# Patient Record
Sex: Male | Born: 1985 | Race: White | Hispanic: No | Marital: Married | Smoking: Former smoker
Health system: Southern US, Community
[De-identification: ages and names within clinical notes are randomized; demographics above are authoritative.]

## PROBLEM LIST (undated history)

## (undated) DIAGNOSIS — F329 Major depressive disorder, single episode, unspecified: Secondary | ICD-10-CM

## (undated) DIAGNOSIS — F909 Attention-deficit hyperactivity disorder, unspecified type: Secondary | ICD-10-CM

## (undated) DIAGNOSIS — F32A Depression, unspecified: Secondary | ICD-10-CM

## (undated) DIAGNOSIS — R519 Headache, unspecified: Secondary | ICD-10-CM

## (undated) DIAGNOSIS — F319 Bipolar disorder, unspecified: Secondary | ICD-10-CM

## (undated) HISTORY — DX: Major depressive disorder, single episode, unspecified: F32.9

## (undated) HISTORY — PX: VARICOCELECTOMY: SHX1084

## (undated) HISTORY — DX: Attention-deficit hyperactivity disorder, unspecified type: F90.9

## (undated) HISTORY — DX: Bipolar disorder, unspecified: F31.9

## (undated) HISTORY — DX: Depression, unspecified: F32.A

---
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
## 2009-12-31 ENCOUNTER — Ambulatory Visit (HOSPITAL_COMMUNITY): Payer: Self-pay | Admitting: Psychiatry

## 2010-02-21 ENCOUNTER — Ambulatory Visit (HOSPITAL_COMMUNITY): Payer: Self-pay | Admitting: Psychiatry

## 2010-04-23 ENCOUNTER — Ambulatory Visit (HOSPITAL_COMMUNITY): Payer: Self-pay | Admitting: Psychiatry

## 2010-06-25 ENCOUNTER — Ambulatory Visit (HOSPITAL_COMMUNITY): Payer: Self-pay | Admitting: Psychiatry

## 2010-07-17 ENCOUNTER — Ambulatory Visit (HOSPITAL_COMMUNITY): Payer: Self-pay | Admitting: Psychiatry

## 2010-09-17 ENCOUNTER — Encounter (HOSPITAL_COMMUNITY): Payer: Self-pay | Admitting: Psychiatry

## 2010-12-05 NOTE — Discharge Summary (Signed)
Robert Salazar, Robert Salazar NO.:  [id number]   MEDICAL RECORD NO.:  [id number]                   PATIENT TYPE:  IPS   LOCATION:  0302                                 FACILITY:  [facility]   PHYSICIAN:  Jeanice Lim, M.D.              DATE OF BIRTH:  1985/10/01   DATE OF ADMISSION:  06/27/2003  DATE OF DISCHARGE:  07/03/2003                                 DISCHARGE SUMMARY   IDENTIFYING DATA:  This is an 25 year old single white male voluntarily  admitted with a history of depression and suicidal ideation for one month.  Feeling like he may act on these.  Complaining of mood swings.  Burning self  with cigarettes.  Poor appetite.   MEDICATIONS:  In the past, on Seroquel, Zoloft and Depakote.  None recently.   ALLERGIES:  No known drug allergies.   PHYSICAL EXAMINATION:  Within normal limits.  Neurologically nonfocal.   LABORATORY DATA:  Routine admission labs within normal limits.   MENTAL STATUS EXAM:  Alert, young male.  Casually dressed.  Cooperative.  Poor eye contact.  Mood dysphoric.  Affect constricted.  Thought processes  goal directed.  Thought content negative for acute suicidal or homicidal  ideation or psychotic symptoms.  Cognitively intact.  Judgment and insight  poor.   ADMISSION DIAGNOSES:   AXIS I:  Rule out bipolar disorder, mixed-phase.   AXIS II:  Deferred.   AXIS III:  None.   AXIS IV:  Problems with primary support group.   AXIS V:  25/60.   HOSPITAL COURSE:  The patient was admitted and ordered routine p.r.n.  medications and underwent further monitoring.  Was encouraged to participate  in individual, group and milieu therapy.  The patient was detoxed with  monitoring for safety.  Depakote level was followed up as well as the  patient participated in therapy and aftercare planning.  The patient  complained of mood swings, auditory or visual hallucinations, anger,  irritability and was stabilized on medications including  Depakote, Zoloft,  Klonopin and Wellbutrin targeting depressive symptoms, acute anxiety and  mood instability.  The patient reported doing much better.  Reported no  hallucinations.  No suicidal or homicidal ideation.  Affect was brighter.   CONDITION ON DISCHARGE:  Markedly improved.  Tolerating medications and  showing increased insight regarding the importance of compliance with the  medications and follow-up.   DISCHARGE MEDICATIONS:  1. Zoloft 50 mg q.a.m.  2. Seroquel 200 mg, 2-1/2 q.h.s.  3. Wellbutrin XL 150 mg q.a.m.  4. Depakote 250 mg q.a.m. and 3 q.h.s.  5. Ambien 10 mg q.h.s.  6. Klonopin 55 mg q.h.s.   FOLLOW UP:  The patient was discharged to follow up with Dr. Milford Cage  on July 09, 2003 at 3:30 p.m.   DISCHARGE DIAGNOSES:   AXIS I:  Rule out bipolar disorder, mixed-phase.   AXIS II:  Deferred.   AXIS III:  None.   AXIS IV:  Problems with primary support group.   AXIS V:  Global Assessment of Functioning on discharge 55.                                               Jeanice Lim, M.D.    JEM/MEDQ  D:  08/01/2003  T:  08/02/2003  Job:  [id number]

## 2011-03-03 ENCOUNTER — Encounter (INDEPENDENT_AMBULATORY_CARE_PROVIDER_SITE_OTHER): Payer: 59 | Admitting: Psychiatry

## 2011-03-03 DIAGNOSIS — F909 Attention-deficit hyperactivity disorder, unspecified type: Secondary | ICD-10-CM

## 2011-03-03 DIAGNOSIS — F319 Bipolar disorder, unspecified: Secondary | ICD-10-CM

## 2011-05-04 ENCOUNTER — Encounter (HOSPITAL_COMMUNITY): Payer: Self-pay

## 2011-06-03 ENCOUNTER — Encounter (HOSPITAL_COMMUNITY): Payer: 59 | Admitting: Psychiatry

## 2011-06-04 ENCOUNTER — Encounter (HOSPITAL_COMMUNITY): Payer: Self-pay | Admitting: Psychiatry

## 2012-03-16 ENCOUNTER — Encounter (HOSPITAL_COMMUNITY): Payer: Self-pay | Admitting: Psychiatry

## 2012-09-20 ENCOUNTER — Ambulatory Visit (HOSPITAL_COMMUNITY): Payer: 59 | Admitting: Psychiatry

## 2018-08-06 NOTE — Progress Notes (Deleted)
   Subjective:    Patient ID: Robert Salazar, male    DOB: 1986-07-14, 34 y.o.   MRN: [id number]  HPI:  [name] is here to establish as a new pt. He is a pleasant 33 year old male. GEX:[id number], ADHD   No care team member to display  There are no active problems to display for this patient.    No past medical history on file.   *** The histories are not reviewed yet. Please review them in the "History" navigator section and refresh this SmartLink.   No family history on file.   Social History   Substance and Sexual Activity  Drug Use Not on file     Social History   Substance and Sexual Activity  Alcohol Use Not on file     Social History   Tobacco Use  Smoking Status Current Every Day Smoker  . Packs/day: 0.10  . Years: 5.00  . Pack years: 0.50  . Types: Cigarettes     Outpatient Encounter Medications as of 08/08/2018  Medication Sig  . citalopram (CELEXA) 10 MG tablet Take 10 mg by mouth daily.    Marland Kitchen lamoTRIgine (LAMICTAL) 150 MG tablet Take 150 mg by mouth daily.    Marland Kitchen lisdexamfetamine (VYVANSE) 60 MG capsule Take 60 mg by mouth every morning.     No facility-administered encounter medications on file as of 08/08/2018.     Allergies: Patient has no allergy information on record.  There is no height or weight on file to calculate BMI.  There were no vitals taken for this visit.     Review of Systems     Objective:   Physical Exam        Assessment & Plan:  No diagnosis found.  No problem-specific Assessment & Plan notes found for this encounter.    FOLLOW-UP:  No follow-ups on file.

## 2018-08-08 ENCOUNTER — Ambulatory Visit: Payer: 59 | Admitting: Adult Health

## 2018-08-24 NOTE — Progress Notes (Signed)
Subjective:    Patient ID: Robert Salazar, male    DOB: Feb 21, 1986, 33 y.o.   MRN: [id number]  HPI:  Robert Salazar is here to establish as a new pt.  He is a pleasant 33 year old male. PMH: ADD, Depression, Anxiety, Bipolar I, Schizoaffective disorder He is [facility] and [facility] He reports stable mood, denies thoughts of harming himself/others He remains active with house/yeard work, ie chopping wood, etc He is inconsistent with hydration, states "I am working on trying to drink > 1/2 gallon a day". He quit tobacco use 2016 He reports occasionally smoking marijuana and 1-2 cocktails/night Discussed possible interaction between alcohol, marijuana and his mental health medications He reports migraines began at age 33, with a month long migraine He estimates to have 1-2 migraines/month He reports that some migraines will last up to 3 days long, those will occur once every 3 month He reports visual disturbance with migraine He has not been seen by Neurologist in decaded He has not had routine physical in years He is married with one three year old son, "Robert Salazar"  Patient Care Team    Relationship Specialty Notifications Start End  Laylaa Guevarra, Jinny BlossomKaty D, NP PCP - General Family Medicine  08/30/18     Patient Active Problem List   Diagnosis Date Noted  . Healthcare maintenance 08/30/2018  . Bipolar 1 disorder (HCC) 08/30/2018  . Depression, recurrent (HCC) 08/30/2018  . GAD (generalized anxiety disorder) 08/30/2018     Past Medical History:  Diagnosis Date  . ADHD   . Bipolar 1 disorder (HCC)   . Depression      Past Surgical History:  Procedure Laterality Date  . VARICOCELECTOMY       Family History  Problem Relation Age of Onset  . Celiac disease Sister   . Cancer Maternal Grandmother        unknown to pt  . Cancer Paternal Grandfather        lung     Social History   Substance and Sexual Activity  Drug Use Never     Social  History   Substance and Sexual Activity  Alcohol Use Yes  . Alcohol/week: 10.0 standard drinks  . Types: 5 Glasses of wine, 5 Cans of beer per week     Social History   Tobacco Use  Smoking Status Former Smoker  . Packs/day: 1.00  . Years: 10.00  . Pack years: 10.00  . Types: Cigarettes  . Last attempt to quit: 07/20/2014  . Years since quitting: 4.1  Smokeless Tobacco Never Used     Outpatient Encounter Medications as of 08/30/2018  Medication Sig  . ALPRAZolam (XANAX) 1 MG tablet Take 1 mg by mouth. Take 1 tablet by mouth one to two times daily as needed for anxiety  . lamoTRIgine (LAMICTAL) 25 MG tablet Take 75 mg by mouth at bedtime.  Marland Kitchen. lisdexamfetamine (VYVANSE) 50 MG capsule Take 50 mg by mouth daily.  . temazepam (RESTORIL) 30 MG capsule Take 30 mg by mouth at bedtime as needed for sleep.  Marland Kitchen. VIIBRYD 40 MG TABS Take 1 tablet by mouth daily.  . [DISCONTINUED] ALPRAZolam (XANAX) 1 MG tablet Take 0.5 tablets by mouth daily as needed.  . [DISCONTINUED] citalopram (CELEXA) 10 MG tablet Take 10 mg by mouth daily.    . [DISCONTINUED] lamoTRIgine (LAMICTAL) 150 MG tablet Take 150 mg by mouth daily.    . [DISCONTINUED] lisdexamfetamine (VYVANSE) 60 MG capsule Take 60 mg by  mouth every morning.    . [DISCONTINUED] VYVANSE 40 MG capsule Take 1 capsule by mouth daily.   No facility-administered encounter medications on file as of 08/30/2018.     Allergies: Patient has no known allergies.  Body mass index is 20.57 kg/m.  Blood pressure 94/61, pulse (!) 59, temperature 99 F (37.2 C), temperature source Oral, height 6' 0.25" (1.835 m), weight 152 lb 11.2 oz (69.3 kg), SpO2 99 %.  Review of Systems  Constitutional: Positive for fatigue. Negative for activity change, appetite change, chills, diaphoresis, fever and unexpected weight change.  HENT: Positive for sore throat.   Respiratory: Negative for cough, chest tightness, shortness of breath, wheezing and stridor.    Cardiovascular: Negative for chest pain, palpitations and leg swelling.  Endocrine: Negative for cold intolerance, heat intolerance, polydipsia, polyphagia and polyuria.  Musculoskeletal: Negative for arthralgias, back pain, gait problem, joint swelling, myalgias, neck pain and neck stiffness.  Skin: Negative for color change, pallor, rash and wound.  Neurological: Positive for headaches. Negative for dizziness.  Hematological: Does not bruise/bleed easily.  Psychiatric/Behavioral: Positive for decreased concentration and dysphoric mood. Negative for agitation, behavioral problems, confusion, hallucinations, self-injury, sleep disturbance and suicidal ideas. The patient is nervous/anxious and is hyperactive.        Objective:   Physical Exam Vitals signs and nursing note reviewed.  Constitutional:      General: He is not in acute distress.    Appearance: He is normal weight. He is not ill-appearing, toxic-appearing or diaphoretic.  Cardiovascular:     Rate and Rhythm: Normal rate.     Pulses: Normal pulses.     Heart sounds: Normal heart sounds. No murmur. No friction rub. No gallop.   Pulmonary:     Effort: Pulmonary effort is normal. No respiratory distress.     Breath sounds: Normal breath sounds. No stridor. No wheezing, rhonchi or rales.  Chest:     Chest wall: No tenderness.  Skin:    Capillary Refill: Capillary refill takes less than 2 seconds.  Neurological:     Mental Status: He is alert and oriented to person, place, and time.     Cranial Nerves: No cranial nerve deficit.     Sensory: No sensory deficit.     Motor: No weakness.     Coordination: Coordination normal.     Gait: Gait normal.     Deep Tendon Reflexes: Reflexes normal.  Psychiatric:        Mood and Affect: Mood normal.        Behavior: Behavior normal.        Thought Content: Thought content normal.        Judgment: Judgment normal.       Assessment & Plan:   1. Need for Tdap vaccination   2.  Screening for HIV (human immunodeficiency virus)   3. Healthcare maintenance   4. Intractable migraine with aura with status migrainosus   5. Bipolar 1 disorder (HCC)   6. Depression, recurrent (HCC)   7. GAD (generalized anxiety disorder)     Healthcare maintenance Continue all medications as directed. Continue regular follow-up with [facility] and your Psychologist and Psychiatrist. Try to consistently drink >75 oz water/day. Recommend complete physical in 4 weeks, fasting lab appt the week prior.  Bipolar 1 disorder (HCC) Followed by Psychiatrist and Psychologist at [facility] and complex medication regime Denies thoughts of harming himself/others Has strong support system Discussed possible interaction between alcohol, marijuana and his mental health medications  Depression, recurrent (HCC) Followed by Psychiatrist and Psychologist at [facility] and complex medication regime Denies thoughts of harming himself/others Has strong support system Discussed possible interaction between alcohol, marijuana and his mental health medications  GAD (generalized anxiety disorder) Followed by Psychiatrist and Psychologist at [facility] and complex medication regime Denies thoughts of harming himself/others Has strong support system Discussed possible interaction between alcohol, marijuana and his mental health medications    FOLLOW-UP:  Return in about 4 weeks (around 09/27/2018) for Fasting Labs, CPE.

## 2018-08-30 ENCOUNTER — Encounter: Payer: Self-pay | Admitting: Adult Health

## 2018-08-30 ENCOUNTER — Ambulatory Visit (INDEPENDENT_AMBULATORY_CARE_PROVIDER_SITE_OTHER): Payer: 59 | Admitting: Adult Health

## 2018-08-30 ENCOUNTER — Encounter: Payer: Self-pay | Admitting: Neurology

## 2018-08-30 VITALS — BP 94/61 | HR 59 | Temp 99.0°F | Ht 72.25 in | Wt 152.7 lb

## 2018-08-30 DIAGNOSIS — Z114 Encounter for screening for human immunodeficiency virus [HIV]: Secondary | ICD-10-CM | POA: Diagnosis not present

## 2018-08-30 DIAGNOSIS — G43111 Migraine with aura, intractable, with status migrainosus: Secondary | ICD-10-CM

## 2018-08-30 DIAGNOSIS — F411 Generalized anxiety disorder: Secondary | ICD-10-CM | POA: Insufficient documentation

## 2018-08-30 DIAGNOSIS — F32A Depression, unspecified: Secondary | ICD-10-CM | POA: Insufficient documentation

## 2018-08-30 DIAGNOSIS — Z23 Encounter for immunization: Secondary | ICD-10-CM

## 2018-08-30 DIAGNOSIS — F319 Bipolar disorder, unspecified: Secondary | ICD-10-CM | POA: Diagnosis not present

## 2018-08-30 DIAGNOSIS — F339 Major depressive disorder, recurrent, unspecified: Secondary | ICD-10-CM

## 2018-08-30 DIAGNOSIS — Z0000 Encounter for general adult medical examination without abnormal findings: Secondary | ICD-10-CM

## 2018-08-30 NOTE — Assessment & Plan Note (Signed)
Followed by Psychiatrist and Psychologist at [facility] and complex medication regime Denies thoughts of harming himself/others Has strong support system Discussed possible interaction between alcohol, marijuana and his mental health medications 

## 2018-08-30 NOTE — Assessment & Plan Note (Signed)
Follow[facility] by [facility] and [facility] and complex medication regime Denies thoughts of harming himself/others Has strong support system Discussed possible interaction between alcohol, marijuana and his mental health medications

## 2018-08-30 NOTE — Patient Instructions (Signed)
Mediterranean Diet A Mediterranean diet refers to food and lifestyle choices that are based on the traditions of countries located on the [employer]. This way of eating has been shown to help prevent certain conditions and improve outcomes for people who have chronic diseases, like kidney disease and heart disease. What are tips for following this plan? Lifestyle  Cook and eat meals together with your family, when possible.  Drink enough fluid to keep your urine clear or pale yellow.  Be physically active every day. This includes: ? Aerobic exercise like running or swimming. ? Leisure activities like gardening, walking, or housework.  Get 7-8 hours of sleep each night.  If recommended by your health care provider, drink red wine in moderation. This means 1 glass a day for nonpregnant women and 2 glasses a day for men. A glass of wine equals 5 oz (150 mL). Reading food labels   Check the serving size of packaged foods. For foods such as rice and pasta, the serving size refers to the amount of cooked product, not dry.  Check the total fat in packaged foods. Avoid foods that have saturated fat or trans fats.  Check the ingredients list for added sugars, such as corn syrup. Shopping  At the grocery store, buy most of your food from the areas near the walls of the store. This includes: ? Fresh fruits and vegetables (produce). ? Grains, beans, nuts, and seeds. Some of these may be available in unpackaged forms or large amounts (in bulk). ? Fresh seafood. ? Poultry and eggs. ? Low-fat dairy products.  Buy whole ingredients instead of prepackaged foods.  Buy fresh fruits and vegetables in-season from local farmers markets.  Buy frozen fruits and vegetables in resealable bags.  If you do not have access to quality fresh seafood, buy precooked frozen shrimp or canned fish, such as tuna, salmon, or sardines.  Buy small amounts of raw or cooked vegetables, salads, or olives from  the deli or salad bar at your store.  Stock your pantry so you always have certain foods on hand, such as olive oil, canned tuna, canned tomatoes, rice, pasta, and beans. Cooking  Cook foods with extra-virgin olive oil instead of using butter or other vegetable oils.  Have meat as a side dish, and have vegetables or grains as your main dish. This means having meat in small portions or adding small amounts of meat to foods like pasta or stew.  Use beans or vegetables instead of meat in common dishes like chili or lasagna.  Experiment with different cooking methods. Try roasting or broiling vegetables instead of steaming or sauteing them.  Add frozen vegetables to soups, stews, pasta, or rice.  Add nuts or seeds for added healthy fat at each meal. You can add these to yogurt, salads, or vegetable dishes.  Marinate fish or vegetables using olive oil, lemon juice, garlic, and fresh herbs. Meal planning   Plan to eat 1 vegetarian meal one day each week. Try to work up to 2 vegetarian meals, if possible.  Eat seafood 2 or more times a week.  Have healthy snacks readily available, such as: ? Vegetable sticks with hummus. ? [place] yogurt. ? Fruit and nut trail mix.  Eat balanced meals throughout the week. This includes: ? Fruit: 2-3 servings a day ? Vegetables: 4-5 servings a day ? Low-fat dairy: 2 servings a day ? Fish, poultry, or lean meat: 1 serving a day ? Beans and legumes: 2 or more servings a week ?  Nuts and seeds: 1-2 servings a day ? Whole grains: 6-8 servings a day ? Extra-virgin olive oil: 3-4 servings a day  Limit red meat and sweets to only a few servings a month What are my food choices?  Mediterranean diet ? Recommended ? Grains: Whole-grain pasta. Brown rice. Bulgar wheat. Polenta. Couscous. Whole-wheat bread. Orpah Cobb. ? Vegetables: Artichokes. Beets. Broccoli. Cabbage. Carrots. Eggplant. Green beans. Chard. Kale. Spinach. Onions. Leeks. Peas. Squash.  Tomatoes. Peppers. Radishes. ? Fruits: Apples. Apricots. Avocado. Berries. Bananas. Cherries. Dates. Figs. Grapes. Lemons. Melon. Oranges. Peaches. Plums. Pomegranate. ? Meats and other protein foods: Beans. Almonds. Sunflower seeds. Pine nuts. Peanuts. Cod. Salmon. Scallops. Shrimp. Tuna. Tilapia. Clams. Oysters. Eggs. ? Dairy: Low-fat milk. Cheese. Greek yogurt. ? Beverages: Water. Red wine. Herbal tea. ? Fats and oils: Extra virgin olive oil. Avocado oil. Grape seed oil. ? Sweets and desserts: [place] yogurt with honey. Baked apples. Poached pears. Trail mix. ? Seasoning and other foods: Basil. Cilantro. Coriander. Cumin. Mint. Parsley. Sage. Rosemary. Tarragon. Garlic. Oregano. Thyme. Pepper. Balsalmic vinegar. Tahini. Hummus. Tomato sauce. Olives. Mushrooms. ? Limit these ? Grains: Prepackaged pasta or rice dishes. Prepackaged cereal with added sugar. ? Vegetables: Deep fried potatoes (french fries). ? Fruits: Fruit canned in syrup. ? Meats and other protein foods: Beef. Pork. Lamb. Poultry with skin. Hot dogs. Tomasa Blase. ? Dairy: Ice cream. Sour cream. Whole milk. ? Beverages: Juice. Sugar-sweetened soft drinks. Beer. Liquor and spirits. ? Fats and oils: Butter. Canola oil. Vegetable oil. Beef fat (tallow). Lard. ? Sweets and desserts: Cookies. Cakes. Pies. Candy. ? Seasoning and other foods: Mayonnaise. Premade sauces and marinades. ? The items listed may not be a complete list. Talk with your dietitian about what dietary choices are right for you. Summary  The Mediterranean diet includes both food and lifestyle choices.  Eat a variety of fresh fruits and vegetables, beans, nuts, seeds, and whole grains.  Limit the amount of red meat and sweets that you eat.  Talk with your health care provider about whether it is safe for you to drink red wine in moderation. This means 1 glass a day for nonpregnant women and 2 glasses a day for men. A glass of wine equals 5 oz (150 mL). This information  is not intended to replace advice given to you by your health care provider. Make sure you discuss any questions you have with your health care provider. Document Released: 02/27/2016 Document Revised: 03/31/2016 Document Reviewed: 02/27/2016 Elsevier Interactive Patient Education  2019 [employer].  Continue all medications as directed. Continue regular follow-up with [facility] and your Psychologist and Psychiatrist. Try to consistently drink >75 oz water/day. Recommend complete physical in 4 weeks, fasting lab appt the week prior. WELCOME TO THE PRACTICE!

## 2018-08-30 NOTE — Assessment & Plan Note (Signed)
Continue all medications as directed. Continue regular follow-up with [facility] and your Psychologist and Psychiatrist. Try to consistently drink >75 oz water/day. Recommend complete physical in 4 weeks, fasting lab appt the week prior.

## 2018-10-10 ENCOUNTER — Other Ambulatory Visit: Payer: 59

## 2018-10-13 ENCOUNTER — Encounter: Payer: 59 | Admitting: Adult Health

## 2018-11-10 ENCOUNTER — Ambulatory Visit: Payer: 59 | Admitting: Neurology

## 2018-12-14 ENCOUNTER — Encounter: Payer: Self-pay | Admitting: Adult Health

## 2018-12-14 ENCOUNTER — Ambulatory Visit: Payer: 59 | Admitting: Adult Health

## 2018-12-14 ENCOUNTER — Other Ambulatory Visit: Payer: Self-pay

## 2018-12-14 VITALS — Temp 99.1°F | Ht 72.25 in | Wt 155.0 lb

## 2018-12-14 NOTE — Progress Notes (Signed)
Subjective:    Patient ID: Robert Salazar, male    DOB: 12-13-1985, 33 y.o.   MRN: [id number]  HPI: [name] presents with insect bite that occurred 4 days ago.  He reports waking up Monday am with "spide bite on my L upper chest". He reports initial piercing pain and severe itching sensation He reports pain starts over L chest with radiation to L shoulder-trapezius- occipital head. He also reports malaise and myalgia's of upper chest He reports low grade fever (highest 99.39f oral). He denies N/V/D. He denies any OTC treatments. Today he denies any pain and he is afebrile- current temp, 98.60f oral. He denies any drainage from site. He denies any noticeable swollen lymph nodes  Fasting labs obtained today  Patient Care Team    Relationship Specialty Notifications Start End  Julaine Fusi, NP PCP - General Family Medicine  08/30/18     Patient Active Problem List   Diagnosis Date Noted  . Insect bite of left front wall of thorax 12/15/2018  . Healthcare maintenance 08/30/2018  . Bipolar 1 disorder (HCC) 08/30/2018  . Depression, recurrent (HCC) 08/30/2018  . GAD (generalized anxiety disorder) 08/30/2018     Past Medical History:  Diagnosis Date  . ADHD   . Bipolar 1 disorder (HCC)   . Depression      Past Surgical History:  Procedure Laterality Date  . VARICOCELECTOMY       Family History  Problem Relation Age of Onset  . Celiac disease Sister   . Cancer Maternal Grandmother        unknown to pt  . Cancer Paternal Grandfather        lung     Social History   Substance and Sexual Activity  Drug Use Never     Social History   Substance and Sexual Activity  Alcohol Use Yes  . Alcohol/week: 10.0 standard drinks  . Types: 5 Glasses of wine, 5 Cans of beer per week     Social History   Tobacco Use  Smoking Status Former Smoker  . Packs/day: 1.00  . Years: 10.00  . Pack years: 10.00  . Types: Cigarettes  . Last attempt to quit: 07/20/2014  .  Years since quitting: 4.4  Smokeless Tobacco Never Used     Outpatient Encounter Medications as of 12/15/2018  Medication Sig  . ALPRAZolam (XANAX) 1 MG tablet Take 1 mg by mouth. Take 1 tablet by mouth one to two times daily as needed for anxiety  . lisdexamfetamine (VYVANSE) 50 MG capsule Take 50 mg by mouth daily.  . temazepam (RESTORIL) 30 MG capsule Take 30 mg by mouth at bedtime as needed for sleep.  Marland Kitchen VIIBRYD 40 MG TABS Take 1 tablet by mouth daily.  . mupirocin cream (BACTROBAN) 2 % Apply 1 application topically 2 (two) times daily.   No facility-administered encounter medications on file as of 12/15/2018.     Allergies: Patient has no known allergies.  Body mass index is 21.55 kg/m.  Blood pressure 95/61, pulse (!) 57, temperature 98.7 F (37.1 C), temperature source Oral, height 6' 0.25" (1.835 m), weight 160 lb (72.6 kg), SpO2 97 %.  Review of Systems  Constitutional: Positive for fatigue. Negative for activity change, appetite change, chills, diaphoresis, fever and unexpected weight change.  HENT: Negative for congestion.   Eyes: Negative for visual disturbance.  Respiratory: Negative for cough, chest tightness, shortness of breath, wheezing and stridor.   Cardiovascular: Negative for chest pain, palpitations and  leg swelling.  Gastrointestinal: Negative for abdominal distention, abdominal pain, anal bleeding, constipation, diarrhea, nausea and vomiting.  Endocrine: Negative for cold intolerance, heat intolerance, polydipsia, polyphagia and polyuria.  Genitourinary: Negative for difficulty urinating and flank pain.  Musculoskeletal: Positive for myalgias. Negative for arthralgias, back pain, gait problem, joint swelling, neck pain and neck stiffness.  Skin: Positive for color change and wound. Negative for pallor and rash.  Neurological: Negative for dizziness and headaches.  Hematological: Does not bruise/bleed easily.  Psychiatric/Behavioral: Negative for agitation,  behavioral problems, confusion, decreased concentration, dysphoric mood, hallucinations, self-injury, sleep disturbance and suicidal ideas. The patient is not nervous/anxious and is not hyperactive.        Objective:   Physical Exam Vitals signs and nursing note reviewed.  Constitutional:      General: He is not in acute distress.    Appearance: Normal appearance. He is not ill-appearing, toxic-appearing or diaphoretic.  Cardiovascular:     Rate and Rhythm: Bradycardia present.     Pulses: Normal pulses.     Heart sounds: Normal heart sounds. No murmur. No friction rub. No gallop.   Pulmonary:     Effort: Pulmonary effort is normal. No respiratory distress.     Breath sounds: Normal breath sounds. No stridor. No wheezing, rhonchi or rales.  Chest:     Chest wall: No tenderness.  Skin:    General: Skin is warm and dry.     Capillary Refill: Capillary refill takes less than 2 seconds.     Findings: Erythema and wound present.          Comments: L anterior chest: 2 insect puncture wounds Top one- measruing 0.6cm in diameter Lower one- measruing 0.3cm in diameter Both with circular areas of erythema No open tissue/drainage/streaking noted No swollen cervical or axillary lymph nodes appreciated  Neurological:     Mental Status: He is alert and oriented to person, place, and time.  Psychiatric:        Mood and Affect: Mood normal.        Behavior: Behavior normal.        Thought Content: Thought content normal.        Judgment: Judgment normal.       Assessment & Plan:   1. Healthcare maintenance   2. Screening for HIV (human immunodeficiency virus)   3. Insect bite of left front wall of thorax, initial encounter     Insect bite of left front wall of thorax In comparison to the pictures from earlier in the week to presentation today- bite site is much improved. Please follow care instructions as detailed above. Please apply Mupirocin 2% cream twice daily. Marland Kitchen.   Healthcare maintenance Will contact you tomorrow with your lab results. Please re-schedule your complete physical    FOLLOW-UP:  Return if symptoms worsen or fail to improve.

## 2018-12-14 NOTE — Progress Notes (Signed)
Appt moved to next day to in clinic

## 2018-12-15 ENCOUNTER — Ambulatory Visit (INDEPENDENT_AMBULATORY_CARE_PROVIDER_SITE_OTHER): Payer: 59 | Admitting: Adult Health

## 2018-12-15 ENCOUNTER — Encounter: Payer: Self-pay | Admitting: Adult Health

## 2018-12-15 DIAGNOSIS — S20362A Insect bite (nonvenomous) of left front wall of thorax, initial encounter: Secondary | ICD-10-CM | POA: Diagnosis not present

## 2018-12-15 DIAGNOSIS — Z0000 Encounter for general adult medical examination without abnormal findings: Secondary | ICD-10-CM

## 2018-12-15 DIAGNOSIS — Z114 Encounter for screening for human immunodeficiency virus [HIV]: Secondary | ICD-10-CM

## 2018-12-15 DIAGNOSIS — W57XXXA Bitten or stung by nonvenomous insect and other nonvenomous arthropods, initial encounter: Secondary | ICD-10-CM | POA: Diagnosis not present

## 2018-12-15 MED ORDER — MUPIROCIN CALCIUM 2 % EX CREA: 1.0000 "application " | TOPICAL_CREAM | Freq: Two times a day (BID) | CUTANEOUS | 0 refills | Status: DC

## 2018-12-15 NOTE — Assessment & Plan Note (Signed)
Will contact you tomorrow with your lab results. Please re-schedule your complete physical

## 2018-12-15 NOTE — Patient Instructions (Addendum)
Spider Bite  Spider bites are not common. Most spider bites do not cause serious problems. There are only a few types of spider bites that can cause serious health problems. Follow these instructions at home: Medicine  Take or apply over-the-counter and prescription medicines only as told by your doctor.  If you were given an antibiotic medicine, take or apply it as told by your doctor. Do not stop using the antibiotic even if your condition improves. General instructions  Do not scratch the bite area.  Keep the bite area clean and dry. Wash the bite area with soap and water every day as told by your doctor.  If directed, apply ice to the bite area. ? Put ice in a plastic bag. ? Place a towel between your skin and the bag. ? Leave the ice on for 20 minutes, 2-3 times per day.  Raise (elevate) the affected area above the level of your heart while you are sitting or lying down, if this is possible.  Keep all follow-up visits as told by your doctor. This is important. Contact a doctor if:  Your bite does not get better after 3 days.  Your bite turns black or purple.  Near the bite, you have: ? Redness. ? Swelling (inflammation). ? Pain that is getting worse. Get help right away if:  You get shortness of breath or chest pain.  You have fluid, blood, or pus coming from the bite area.  You have muscle cramps or painful muscle spasms.  You have stomach (abdominal) pain.  You feel sick to your stomach (nauseous) or you throw up (vomit).  You feel more tired or sleepy than you normally do. This information is not intended to replace advice given to you by your health care provider. Make sure you discuss any questions you have with your health care provider. Document Released: 08/08/2010 Document Revised: 03/02/2016 Document Reviewed: 11/21/2014 Elsevier Interactive Patient Education  [employer].   In comparison to the pictures from earlier in the week to presentation  today- bite site is much improved. Please follow care instructions as detailed above. Please apply Mupirocin 2% cream twice daily. Will contact you tomorrow with your lab results. Please re-schedule your complete physical. NICE TO SEE YOU!

## 2018-12-15 NOTE — Assessment & Plan Note (Signed)
In comparison to the pictures from earlier in the week to presentation today- bite site is much improved. Please follow care instructions as detailed above. Please apply Mupirocin 2% cream twice daily. Marland Kitchen

## 2018-12-16 ENCOUNTER — Encounter: Payer: Self-pay | Admitting: Adult Health

## 2018-12-16 LAB — CBC WITH DIFFERENTIAL/PLATELET
Basophils Absolute: 0 10*3/uL (ref 0.0–0.2)
Basos: 1 %
EOS (ABSOLUTE): 0.2 10*3/uL (ref 0.0–0.4)
Eos: 4 %
Hematocrit: 45.1 % (ref 37.5–51.0)
Hemoglobin: 16 g/dL (ref 13.0–17.7)
Immature Grans (Abs): 0 10*3/uL (ref 0.0–0.1)
Immature Granulocytes: 0 %
Lymphocytes Absolute: 1.6 10*3/uL (ref 0.7–3.1)
Lymphs: 31 %
MCH: 34 pg — ABNORMAL HIGH (ref 26.6–33.0)
MCHC: 35.5 g/dL (ref 31.5–35.7)
MCV: 96 fL (ref 79–97)
Monocytes Absolute: 0.5 10*3/uL (ref 0.1–0.9)
Monocytes: 11 %
Neutrophils Absolute: 2.7 10*3/uL (ref 1.4–7.0)
Neutrophils: 53 %
Platelets: 221 10*3/uL (ref 150–450)
RBC: 4.71 x10E6/uL (ref 4.14–5.80)
RDW: 12.7 % (ref 11.6–15.4)
WBC: 5 10*3/uL (ref 3.4–10.8)

## 2018-12-16 LAB — COMPREHENSIVE METABOLIC PANEL
ALT: 32 IU/L (ref 0–44)
AST: 34 IU/L (ref 0–40)
Albumin/Globulin Ratio: 1.9 (ref 1.2–2.2)
Albumin: 5 g/dL (ref 4.0–5.0)
Alkaline Phosphatase: 54 IU/L (ref 39–117)
BUN/Creatinine Ratio: 11 (ref 9–20)
BUN: 13 mg/dL (ref 6–20)
Bilirubin Total: 0.7 mg/dL (ref 0.0–1.2)
CO2: 24 mmol/L (ref 20–29)
Calcium: 9.8 mg/dL (ref 8.7–10.2)
Chloride: 98 mmol/L (ref 96–106)
Creatinine, Ser: 1.18 mg/dL (ref 0.76–1.27)
GFR calc Af Amer: 93 mL/min/{1.73_m2} (ref >59)
GFR calc non Af Amer: 81 mL/min/{1.73_m2} (ref >59)
Globulin, Total: 2.6 g/dL (ref 1.5–4.5)
Glucose: 89 mg/dL (ref 65–99)
Potassium: 5.2 mmol/L (ref 3.5–5.2)
Sodium: 137 mmol/L (ref 134–144)
Total Protein: 7.6 g/dL (ref 6.0–8.5)

## 2018-12-16 LAB — LIPID PANEL
Chol/HDL Ratio: 3.6 ratio (ref 0.0–5.0)
Cholesterol, Total: 245 mg/dL — ABNORMAL HIGH (ref 100–199)
HDL: 68 mg/dL (ref >39)
LDL Calculated: 154 mg/dL — ABNORMAL HIGH (ref 0–99)
Triglycerides: 113 mg/dL (ref 0–149)
VLDL Cholesterol Cal: 23 mg/dL (ref 5–40)

## 2018-12-16 LAB — HIV ANTIBODY (ROUTINE TESTING W REFLEX): HIV Screen 4th Generation wRfx: NONREACTIVE

## 2018-12-16 LAB — TSH: TSH: 2.4 u[IU]/mL (ref 0.450–4.500)

## 2018-12-16 LAB — HEMOGLOBIN A1C
Est. average glucose Bld gHb Est-mCnc: 94 mg/dL
Hgb A1c MFr Bld: 4.9 % (ref 4.8–5.6)

## 2018-12-27 NOTE — Progress Notes (Signed)
Subjective:    Patient ID: Robert Salazar, male    DOB: 11-11-1985, 33 y.o.   MRN: [id number]  HPI:  Robert Salazar is here for CPE He reports stable mood and denies medication SE on alprazolam, Vyvanse, and Temazepam. He is followed by Psychiatrist monthly. He reports good focus/concentration with Vyvanse. 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  Cholesterol Panel-  Some elevations  Total Chol- 245, normal <199  Trigs- nomal 113  HDL (good cholesterol) 68- very good, the higher this number the more cardiovascular protective health  LDL (bad cholesterol) 154,elevated, normal is <99  Please focus on reducing saturated fat in your diet to help normalize your total and LDL levels.  Since your HDL is so high it is actually negating some the bad the levels.  Recommend you work on your diet and we re-check your cholesterol levels in 6 months.   Healthcare Maintenance: Immunizations:UTD   Patient Care Team    Relationship Specialty Notifications Start End  JasperDanford, Jinny BlossomKaty D, NP PCP - General Family Medicine  08/30/18     Patient Active Problem List   Diagnosis Date Noted  . Elevated LDL cholesterol level 12/28/2018  . Insect bite of left front wall of thorax 12/15/2018  . Healthcare maintenance 08/30/2018  . Bipolar 1 disorder (HCC) 08/30/2018  . Depression, recurrent (HCC) 08/30/2018  . GAD (generalized anxiety disorder) 08/30/2018     Past Medical History:  Diagnosis Date  . ADHD   . Bipolar 1 disorder (HCC)   . Depression      Past Surgical History:  Procedure Laterality Date  . VARICOCELECTOMY       Family History  Problem Relation Age of Onset  .  Celiac disease Sister   . Cancer Maternal Grandmother        unknown to pt  . Cancer Paternal Grandfather        lung     Social History   Substance and Sexual Activity  Drug Use Never     Social History   Substance and Sexual Activity  Alcohol Use Yes  . Alcohol/week: 10.0 standard drinks  . Types: 5 Glasses of wine, 5 Cans of beer per week     Social History   Tobacco Use  Smoking Status Former Smoker  . Packs/day: 1.00  . Years: 10.00  . Pack years: 10.00  . Types: Cigarettes  . Last attempt to quit: 07/20/2014  . Years since quitting: 4.4  Smokeless Tobacco Never Used     Outpatient Encounter Medications as of 12/28/2018  Medication Sig  . ALPRAZolam (XANAX) 1 MG tablet Take 1 mg by mouth. Take 1 tablet by mouth one to two times daily as needed for anxiety  . lisdexamfetamine (VYVANSE) 50 MG capsule Take 50 mg by mouth daily.  . mupirocin cream (BACTROBAN) 2 % Apply 1 application topically 2 (two) times daily.  . temazepam (RESTORIL) 30 MG capsule Take 30 mg by mouth at bedtime as needed for sleep.  Marland Kitchen. VIIBRYD 40 MG TABS Take 1 tablet by mouth daily.   No facility-administered encounter medications on file as of 12/28/2018.     Allergies: Patient has no  known allergies.  Body mass index is 20.88 kg/m.  Blood pressure 97/65, pulse 78, temperature 98.8 F (37.1 C), temperature source Oral, height 6' 0.75" (1.848 m), weight 157 lb 3.2 oz (71.3 kg), SpO2 96 %.     Review of Systems  Constitutional: Negative for activity change, appetite change, chills, diaphoresis, fatigue, fever and unexpected weight change.  HENT: Negative for congestion.   Eyes: Negative for visual disturbance.  Respiratory: Negative for cough, chest tightness, shortness of breath, wheezing and stridor.   Cardiovascular: Negative for chest pain, palpitations and leg swelling.  Gastrointestinal: Negative for abdominal distention, abdominal pain, anal bleeding, blood in stool,  constipation, diarrhea, nausea and vomiting.  Endocrine: Negative for cold intolerance, heat intolerance, polydipsia, polyphagia and polyuria.  Genitourinary: Negative for difficulty urinating and flank pain.  Musculoskeletal: Negative for arthralgias, back pain, gait problem, joint swelling, myalgias, neck pain and neck stiffness.  Skin: Negative for color change, pallor, rash and wound.  Neurological: Negative for dizziness and headaches.  Hematological: Does not bruise/bleed easily.  Psychiatric/Behavioral: Negative for agitation, behavioral problems, confusion, decreased concentration, dysphoric mood, hallucinations, self-injury, sleep disturbance and suicidal ideas. The patient is not nervous/anxious and is not hyperactive.        Objective:   Physical Exam Vitals signs and nursing note reviewed.  Constitutional:      General: He is not in acute distress.    Appearance: Normal appearance. He is normal weight. He is not ill-appearing, toxic-appearing or diaphoretic.  HENT:     Head: Normocephalic and atraumatic.     Right Ear: Tympanic membrane, ear canal and external ear normal. There is no impacted cerumen.     Left Ear: Tympanic membrane, ear canal and external ear normal.     Nose: Nose normal. No congestion.     Mouth/Throat:     Mouth: Mucous membranes are moist.     Pharynx: No oropharyngeal exudate or posterior oropharyngeal erythema.  Eyes:     Extraocular Movements: Extraocular movements intact.     Conjunctiva/sclera: Conjunctivae normal.     Pupils: Pupils are equal, round, and reactive to light.  Neck:     Musculoskeletal: Normal range of motion. No muscular tenderness.  Cardiovascular:     Rate and Rhythm: Normal rate.     Pulses: Normal pulses.     Heart sounds: Normal heart sounds. No murmur. No friction rub. No gallop.   Pulmonary:     Effort: Pulmonary effort is normal. No respiratory distress.     Breath sounds: Normal breath sounds. No stridor. No  wheezing, rhonchi or rales.  Chest:     Chest wall: No tenderness.  Abdominal:     General: Abdomen is flat. Bowel sounds are normal. There is no distension.     Palpations: Abdomen is soft. There is no mass.     Tenderness: There is no abdominal tenderness. There is no right CVA tenderness, left CVA tenderness, guarding or rebound.     Hernia: No hernia is present.  Musculoskeletal: Normal range of motion.        General: No swelling, tenderness, deformity or signs of injury.     Right lower leg: No edema.     Left lower leg: No edema.  Skin:    General: Skin is warm and dry.     Capillary Refill: Capillary refill takes less than 2 seconds.     Coloration: Skin is not pale.  Neurological:     Mental Status: He is alert and oriented  to person, place, and time.     Sensory: No sensory deficit.     Coordination: Coordination normal.  Psychiatric:        Mood and Affect: Mood normal.        Behavior: Behavior normal.        Thought Content: Thought content normal.        Judgment: Judgment normal.       Assessment & Plan:   1. Elevated LDL cholesterol level   2. Healthcare maintenance     Healthcare maintenance  Overall your blood pressure, weight, lab work looks good, with exception of Total Cholesterol- 245 (normal <199) LDL (bad) cholesterol- 154 (normal <99) Please focus on significant reduction of saturated fat (ie cheese, whole milk, eggs, red meat). Recommend re-checking cholesterol levels in 6 months- please schedule fasting lab appt in 6 months. Remain well hydrated and continue regular exercise. Please schedule complete physical in 1 year. Continue to social distance and wear a mask when in public.  Elevated LDL cholesterol level June 2020 Total Chol- 245, normal <199  Trigs- nomal 113  HDL (good cholesterol) 68- very good, the higher this number the more cardiovascular protective health  LDL (bad cholesterol) 154,elevated, normal is <99  Please focus on  reducing saturated fat in your diet to help normalize your total and LDL levels.  Since your HDL is so high it is actually negating some the bad the levels.  Recommend you work on your diet and we re-check your cholesterol levels in 6 months.     FOLLOW-UP:  Return in about 6 months (around 06/29/2019) for Fasting Labs.

## 2018-12-28 ENCOUNTER — Other Ambulatory Visit: Payer: Self-pay

## 2018-12-28 ENCOUNTER — Encounter: Payer: Self-pay | Admitting: Adult Health

## 2018-12-28 ENCOUNTER — Ambulatory Visit (INDEPENDENT_AMBULATORY_CARE_PROVIDER_SITE_OTHER): Payer: 59 | Admitting: Adult Health

## 2018-12-28 VITALS — BP 97/65 | HR 78 | Temp 98.8°F | Ht 72.75 in | Wt 157.2 lb

## 2018-12-28 DIAGNOSIS — Z0000 Encounter for general adult medical examination without abnormal findings: Secondary | ICD-10-CM | POA: Diagnosis not present

## 2018-12-28 DIAGNOSIS — E7800 Pure hypercholesterolemia, unspecified: Secondary | ICD-10-CM | POA: Diagnosis not present

## 2018-12-28 NOTE — Assessment & Plan Note (Signed)
June 2020 Total Chol- 245, normal <199  Trigs- nomal 113  HDL (good cholesterol) 68- very good, the higher this number the more cardiovascular protective health  LDL (bad cholesterol) 154,elevated, normal is <99  Please focus on reducing saturated fat in your diet to help normalize your total and LDL levels.  Since your HDL is so high it is actually negating some the bad the levels.  Recommend you work on your diet and we re-check your cholesterol levels in 6 months.

## 2018-12-28 NOTE — Assessment & Plan Note (Signed)
  Overall your blood pressure, weight, lab work looks good, with exception of Total Cholesterol- 245 (normal <199) LDL (bad) cholesterol- 154 (normal <99) Please focus on significant reduction of saturated fat (ie cheese, whole milk, eggs, red meat). Recommend re-checking cholesterol levels in 6 months- please schedule fasting lab appt in 6 months. Remain well hydrated and continue regular exercise. Please schedule complete physical in 1 year. Continue to social distance and wear a mask when in public.

## 2018-12-28 NOTE — Patient Instructions (Addendum)

## 2019-01-12 ENCOUNTER — Ambulatory Visit: Payer: 59 | Admitting: Neurology

## 2019-01-22 ENCOUNTER — Encounter: Payer: Self-pay | Admitting: Adult Health

## 2019-01-23 ENCOUNTER — Telehealth: Payer: Self-pay | Admitting: Neurology

## 2019-01-23 NOTE — Telephone Encounter (Signed)
Patient left msg with after hours about having a migraine so bad he had hearing loss. Thanks!
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
## 2019-01-23 NOTE — Telephone Encounter (Signed)
Called and spoke with Thurmond Butts. I advised him he will need to r/s the appt that was cx before we can offer any type of medical advice. I did advise him he should be evaluated in ED or Urgent care. Transferred him to front office to make an appt.

## 2019-01-24 ENCOUNTER — Other Ambulatory Visit: Payer: Self-pay

## 2019-01-24 ENCOUNTER — Ambulatory Visit (INDEPENDENT_AMBULATORY_CARE_PROVIDER_SITE_OTHER): Payer: 59 | Admitting: Adult Health

## 2019-01-24 ENCOUNTER — Encounter: Payer: Self-pay | Admitting: Adult Health

## 2019-01-24 DIAGNOSIS — Z0000 Encounter for general adult medical examination without abnormal findings: Secondary | ICD-10-CM

## 2019-01-24 DIAGNOSIS — H9192 Unspecified hearing loss, left ear: Secondary | ICD-10-CM

## 2019-01-24 DIAGNOSIS — IMO0001 Reserved for inherently not codable concepts without codable children: Secondary | ICD-10-CM | POA: Insufficient documentation

## 2019-01-24 NOTE — Assessment & Plan Note (Signed)
1) If your hearing does not improve in 48 hrs, please call the clinic and we will refer your to an Audiologist for evaluation. 2) please avoid exposure to loud noises and if your will be in vicinity of loud noises recommend hearing protection. 3) please keep your Neurology appt on 02/06/2019 with Dr. Tomi Likens.

## 2019-01-24 NOTE — Assessment & Plan Note (Signed)
Continue to social distance and when out in public wear a mask. 

## 2019-01-24 NOTE — Patient Instructions (Addendum)
Hearing Loss Hearing loss is a partial or total loss of the ability to hear. This can be temporary or permanent, and it can happen in one or both ears. Medical care is necessary to treat hearing loss properly and to prevent the condition from getting worse. Your hearing may partially or completely come back, depending on what caused your hearing loss and how severe it is. In some cases, hearing loss is permanent. What are the causes? Common causes of hearing loss include:  Too much wax in the ear canal.  Infection of the ear canal or middle ear.  Fluid in the middle ear.  Injury to the ear or surrounding area.  An object stuck in the ear.  A history of prolonged exposure to loud sounds, such as music. Less common causes of hearing loss include:  Tumors in the ear.  Viral or bacterial infections, such as meningitis.  A hole in the eardrum (perforated eardrum).  Problems with the hearing nerve that sends signals between the brain and the ear.  Certain medicines. What are the signs or symptoms? Symptoms of this condition may include:  Difficulty telling the difference between sounds.  Difficulty following a conversation when there is background noise.  Lack of response to sounds in your environment. This may be most noticeable when you do not respond to startling sounds.  Needing to turn up the volume on the television, radio, or other devices.  Ringing in the ears.  Dizziness. How is this diagnosed? This condition is diagnosed based on:  A physical exam.  A hearing test (audiometry). The audiometry test will be performed by a hearing specialist (audiologist). You may also be referred to an ear, nose, and throat (ENT) specialist (otolaryngologist). How is this treated? Treatment for hearing loss may include:  Ear wax removal.  Medicines to treat or prevent infection (antibiotics).  Medicines to reduce inflammation (corticosteroids).  Hearing aids for hearing  loss related to nerve damage. Follow these instructions at home:  If you were prescribed an antibiotic medicine, take it as told by your health care provider. Do not stop taking the antibiotic even if you start to feel better.  Take over-the-counter and prescription medicines only as told by your health care provider.  Avoid loud noises.  Return to your normal activities as told by your health care provider. Ask your health care provider what activities are safe for you.  Keep all follow-up visits as told by your health care provider. This is important. Contact a health care provider if:  You feel dizzy.  You develop new symptoms.  You vomit or feel nauseous.  You have a fever. Get help right away if:  You develop sudden changes in your vision.  You have severe ear pain.  You have new or increased weakness.  You have a severe headache. Summary  Hearing loss is a decreased ability to hear sounds around you. It can be temporary or permanent.  Treatment will depend on the cause of your hearing loss. It may include ear wax removal, medicines, or a hearing aid.  Your hearing may partially or completely come back, depending on what caused your hearing loss and how severe it is.  Keep all follow-up visits as told by your health care provider. This is important. This information is not intended to replace advice given to you by your health care provider. Make sure you discuss any questions you have with your health care provider. Document Released: 07/06/2005 Document Revised: 04/05/2018 Document Reviewed: 04/05/2018   Elsevier Patient Education  [employer].   1) If your hearing does not improve in 48 hrs, please call the clinic and we will refer your to an Audiologist for evaluation. 2) please avoid exposure to loud noises and if your will be in vicinity of loud noises recommend hearing protection. 3) please keep your Neurology appt on 02/06/2019 with Dr. Tomi Likens. 4)  continue to social distance and when out in public wear a mask. NICE TO SEE YOU!
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
## 2019-02-06 ENCOUNTER — Other Ambulatory Visit: Payer: Self-pay

## 2019-02-06 ENCOUNTER — Encounter: Payer: Self-pay | Admitting: Neurology

## 2019-02-06 ENCOUNTER — Telehealth (INDEPENDENT_AMBULATORY_CARE_PROVIDER_SITE_OTHER): Payer: 59 | Admitting: Neurology

## 2019-02-06 VITALS — Temp 98.3°F | Ht 72.0 in | Wt 155.0 lb

## 2019-02-06 DIAGNOSIS — G43109 Migraine with aura, not intractable, without status migrainosus: Secondary | ICD-10-CM

## 2019-02-06 DIAGNOSIS — H9192 Unspecified hearing loss, left ear: Secondary | ICD-10-CM

## 2019-02-06 MED ORDER — RIZATRIPTAN BENZOATE 10 MG PO TBDP: ORAL_TABLET | ORAL | 3 refills | Status: DC
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
## 2019-02-06 NOTE — Addendum Note (Signed)
Addended by: Clois Comber on: 02/06/2019 08:35 AM   Modules accepted: Orders

## 2019-02-14 ENCOUNTER — Ambulatory Visit: Payer: 59 | Admitting: Adult Health

## 2019-03-02 ENCOUNTER — Ambulatory Visit
Admission: RE | Admit: 2019-03-02 | Discharge: 2019-03-02 | Disposition: A | Discharge status: Home / Self Care | Payer: 59 | Source: Ambulatory Visit | Attending: Neurology | Admitting: Neurology

## 2019-03-02 DIAGNOSIS — H9192 Unspecified hearing loss, left ear: Secondary | ICD-10-CM

## 2019-03-02 MED ORDER — GADOBENATE DIMEGLUMINE 529 MG/ML IV SOLN
15.0000 mL | Freq: Once | INTRAVENOUS | Status: AC | PRN
  Administered 2019-03-02: 15 mL via INTRAVENOUS

## 2019-03-06 ENCOUNTER — Telehealth: Payer: Self-pay

## 2019-03-06 NOTE — Telephone Encounter (Signed)
-----   Message from Pieter Partridge, DO sent at 03/03/2019  7:52 AM EDT ----- MRI of brain and inner ear structures are normal. No evidence of any abnormality that would cause hearing loss

## 2019-03-06 NOTE — Telephone Encounter (Signed)
Called Pt, LMOVM advising of MRI results and that I am sending this as a message via My Chart.

## 2019-03-11 ENCOUNTER — Encounter: Payer: Self-pay | Admitting: Adult Health

## 2019-03-14 ENCOUNTER — Other Ambulatory Visit: Payer: Self-pay

## 2019-03-14 DIAGNOSIS — H9192 Unspecified hearing loss, left ear: Secondary | ICD-10-CM

## 2019-03-22 DIAGNOSIS — H9313 Tinnitus, bilateral: Secondary | ICD-10-CM | POA: Insufficient documentation

## 2019-04-01 ENCOUNTER — Encounter: Payer: Self-pay | Admitting: Adult Health

## 2019-04-03 ENCOUNTER — Other Ambulatory Visit: Payer: Self-pay | Admitting: *Deleted

## 2019-04-03 DIAGNOSIS — Z20822 Contact with and (suspected) exposure to covid-19: Secondary | ICD-10-CM

## 2019-04-04 LAB — NOVEL CORONAVIRUS, NAA: SARS-CoV-2, NAA: NOT DETECTED

## 2019-05-25 ENCOUNTER — Telehealth: Payer: Self-pay | Admitting: Neurology

## 2019-05-25 NOTE — Telephone Encounter (Signed)
Pt has virtual visit on 06/12/19  Called spoke with patient he was informed of provider response sent to front desk for schedule appt for evaluation

## 2019-05-25 NOTE — Telephone Encounter (Signed)
Spoke with patient she states that numbness left side of face symptoms started on friday denies pain in face, neck pain left side, tension in neck. Requesting provider recommendation. 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

## 2019-05-25 NOTE — Telephone Encounter (Signed)
Patient is having numbness leg side- left jaw pops and is numb. It is only the lower trigeminal nerve. He has an appt at 5:45 so if you don't reach him during that time that's why and leave VM. Thanks!

## 2019-05-25 NOTE — Telephone Encounter (Signed)
I don't know.  This is a new symptom to me.  He would need to make a follow up appointment
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
## 2019-06-12 ENCOUNTER — Encounter

## 2019-06-12 ENCOUNTER — Encounter: Payer: Self-pay | Admitting: Neurology

## 2019-06-12 ENCOUNTER — Other Ambulatory Visit: Payer: Self-pay

## 2019-06-12 ENCOUNTER — Telehealth (INDEPENDENT_AMBULATORY_CARE_PROVIDER_SITE_OTHER): Payer: Self-pay | Admitting: Neurology

## 2019-06-12 VITALS — Ht 70.0 in | Wt 155.0 lb

## 2019-06-12 DIAGNOSIS — H9202 Otalgia, left ear: Secondary | ICD-10-CM

## 2019-06-12 DIAGNOSIS — R200 Anesthesia of skin: Secondary | ICD-10-CM

## 2019-06-12 DIAGNOSIS — M542 Cervicalgia: Secondary | ICD-10-CM

## 2019-06-29 ENCOUNTER — Other Ambulatory Visit: Payer: 59

## 2019-07-12 ENCOUNTER — Other Ambulatory Visit: Payer: Self-pay

## 2019-07-12 ENCOUNTER — Other Ambulatory Visit: Payer: 59

## 2019-07-12 DIAGNOSIS — E7800 Pure hypercholesterolemia, unspecified: Secondary | ICD-10-CM

## 2019-07-13 ENCOUNTER — Encounter: Payer: Self-pay | Admitting: Adult Health

## 2019-07-13 LAB — LIPID PANEL
Chol/HDL Ratio: 4.2 ratio (ref 0.0–5.0)
Cholesterol, Total: 202 mg/dL — ABNORMAL HIGH (ref 100–199)
HDL: 48 mg/dL (ref >39)
LDL Chol Calc (NIH): 134 mg/dL — ABNORMAL HIGH (ref 0–99)
Triglycerides: 109 mg/dL (ref 0–149)
VLDL Cholesterol Cal: 20 mg/dL (ref 5–40)

## 2019-08-01 ENCOUNTER — Encounter: Payer: Self-pay | Admitting: Adult Health

## 2019-08-01 NOTE — Progress Notes (Signed)
Received Epic notification that pt has not read MyChart message regarding results.     Recent Cholesterol Panel  From  Julaine Fusi, NP To  Juluis Rainier Sent and Delivered  07/13/2019 8:39 AM  Good Morning Mr. Ducat,  Your Total Cholesterol has decreased from 245 to 202, just over goal of 199.  LDL (Bad) cholesterol has decreased from 154 to 134 however still above goal of 99.  Your HDL (good) cholesterol decreased from 68 to 48.  Overall things are improving!  Sincerely,  [employer] User Last Read On  CRESPIN FORSTROM Not Read      Letter mailed to pt.  Tiajuana Amass, CMA

## 2019-08-16 ENCOUNTER — Ambulatory Visit: Payer: 59 | Attending: Internal Medicine

## 2019-08-16 DIAGNOSIS — Z20822 Contact with and (suspected) exposure to covid-19: Secondary | ICD-10-CM

## 2019-08-17 LAB — NOVEL CORONAVIRUS, NAA: SARS-CoV-2, NAA: NOT DETECTED

## 2019-08-19 ENCOUNTER — Inpatient Hospital Stay (HOSPITAL_COMMUNITY)
Admission: AD | Admit: 2019-08-19 | Discharge: 2019-08-21 | DRG: 897 | Disposition: A | Discharge status: Home / Self Care | Payer: 59 | Source: Intra-hospital | Attending: Psychiatry | Admitting: Psychiatry

## 2019-08-19 ENCOUNTER — Ambulatory Visit (HOSPITAL_COMMUNITY): Payer: Self-pay

## 2019-08-19 ENCOUNTER — Encounter (HOSPITAL_COMMUNITY): Payer: Self-pay | Admitting: Psychiatry

## 2019-08-19 ENCOUNTER — Other Ambulatory Visit: Payer: Self-pay

## 2019-08-19 ENCOUNTER — Emergency Department (HOSPITAL_COMMUNITY)
Admission: EM | Admit: 2019-08-19 | Discharge: 2019-08-19 | Disposition: A | Discharge status: Home / Self Care | Payer: 59 | Source: Home / Self Care | Attending: Emergency Medicine | Admitting: Emergency Medicine

## 2019-08-19 ENCOUNTER — Encounter: Payer: Self-pay | Admitting: Nurse Practitioner

## 2019-08-19 DIAGNOSIS — F329 Major depressive disorder, single episode, unspecified: Secondary | ICD-10-CM | POA: Diagnosis present

## 2019-08-19 DIAGNOSIS — Z818 Family history of other mental and behavioral disorders: Secondary | ICD-10-CM | POA: Diagnosis not present

## 2019-08-19 DIAGNOSIS — Z87891 Personal history of nicotine dependence: Secondary | ICD-10-CM

## 2019-08-19 DIAGNOSIS — Z79899 Other long term (current) drug therapy: Secondary | ICD-10-CM | POA: Insufficient documentation

## 2019-08-19 DIAGNOSIS — F909 Attention-deficit hyperactivity disorder, unspecified type: Secondary | ICD-10-CM | POA: Diagnosis present

## 2019-08-19 DIAGNOSIS — Z8379 Family history of other diseases of the digestive system: Secondary | ICD-10-CM | POA: Diagnosis not present

## 2019-08-19 DIAGNOSIS — F1020 Alcohol dependence, uncomplicated: Secondary | ICD-10-CM | POA: Diagnosis not present

## 2019-08-19 DIAGNOSIS — F1024 Alcohol dependence with alcohol-induced mood disorder: Secondary | ICD-10-CM | POA: Diagnosis present

## 2019-08-19 DIAGNOSIS — Z807 Family history of other malignant neoplasms of lymphoid, hematopoietic and related tissues: Secondary | ICD-10-CM | POA: Diagnosis not present

## 2019-08-19 DIAGNOSIS — R45851 Suicidal ideations: Secondary | ICD-10-CM | POA: Insufficient documentation

## 2019-08-19 DIAGNOSIS — F10229 Alcohol dependence with intoxication, unspecified: Secondary | ICD-10-CM | POA: Diagnosis present

## 2019-08-19 DIAGNOSIS — F10239 Alcohol dependence with withdrawal, unspecified: Secondary | ICD-10-CM | POA: Diagnosis present

## 2019-08-19 DIAGNOSIS — Z046 Encounter for general psychiatric examination, requested by authority: Secondary | ICD-10-CM

## 2019-08-19 DIAGNOSIS — G4700 Insomnia, unspecified: Secondary | ICD-10-CM | POA: Diagnosis present

## 2019-08-19 DIAGNOSIS — F319 Bipolar disorder, unspecified: Secondary | ICD-10-CM | POA: Diagnosis present

## 2019-08-19 DIAGNOSIS — Z20822 Contact with and (suspected) exposure to covid-19: Secondary | ICD-10-CM | POA: Diagnosis present

## 2019-08-19 HISTORY — DX: Headache, unspecified: R51.9

## 2019-08-19 LAB — COMPREHENSIVE METABOLIC PANEL
ALT: 17 U/L (ref 0–44)
AST: 24 U/L (ref 15–41)
Albumin: 4.8 g/dL (ref 3.5–5.0)
Alkaline Phosphatase: 56 U/L (ref 38–126)
Anion gap: 11 (ref 5–15)
BUN: 11 mg/dL (ref 6–20)
CO2: 24 mmol/L (ref 22–32)
Calcium: 9.2 mg/dL (ref 8.9–10.3)
Chloride: 105 mmol/L (ref 98–111)
Creatinine, Ser: 1.11 mg/dL (ref 0.61–1.24)
GFR calc Af Amer: >60 mL/min (ref >60)
GFR calc non Af Amer: >60 mL/min (ref >60)
Glucose, Bld: 117 mg/dL — ABNORMAL HIGH (ref 70–99)
Potassium: 3.8 mmol/L (ref 3.5–5.1)
Sodium: 140 mmol/L (ref 135–145)
Total Bilirubin: 0.6 mg/dL (ref 0.3–1.2)
Total Protein: 8.4 g/dL — ABNORMAL HIGH (ref 6.5–8.1)

## 2019-08-19 LAB — CBC
HCT: 46.5 % (ref 39.0–52.0)
Hemoglobin: 16 g/dL (ref 13.0–17.0)
MCH: 34 pg (ref 26.0–34.0)
MCHC: 34.4 g/dL (ref 30.0–36.0)
MCV: 98.7 fL (ref 80.0–100.0)
Platelets: 258 10*3/uL (ref 150–400)
RBC: 4.71 MIL/uL (ref 4.22–5.81)
RDW: 11.8 % (ref 11.5–15.5)
WBC: 11.8 10*3/uL — ABNORMAL HIGH (ref 4.0–10.5)
nRBC: 0 % (ref 0.0–0.2)

## 2019-08-19 LAB — SALICYLATE LEVEL: Salicylate Lvl: <7 mg/dL — ABNORMAL LOW (ref 7.0–30.0)

## 2019-08-19 LAB — ACETAMINOPHEN LEVEL: Acetaminophen (Tylenol), Serum: <10 ug/mL — ABNORMAL LOW (ref 10–30)

## 2019-08-19 LAB — ETHANOL: Alcohol, Ethyl (B): 192 mg/dL — ABNORMAL HIGH (ref <10)

## 2019-08-19 MED ORDER — THIAMINE HCL 100 MG PO TABS
100.0000 mg | ORAL_TABLET | Freq: Every day | ORAL | Status: DC
  Administered 2019-08-19 – 2019-08-21 (×3): 100 mg via ORAL
  Filled 2019-08-19 (×6): qty 1

## 2019-08-19 MED ORDER — LISDEXAMFETAMINE DIMESYLATE 50 MG PO CAPS: 50.0000 mg | ORAL_CAPSULE | Freq: Every day | ORAL | Status: DC

## 2019-08-19 MED ORDER — ACETAMINOPHEN 325 MG PO TABS
650.0000 mg | ORAL_TABLET | Freq: Four times a day (QID) | ORAL | Status: DC | PRN
  Administered 2019-08-19 – 2019-08-21 (×4): 650 mg via ORAL
  Filled 2019-08-19 (×5): qty 2

## 2019-08-19 MED ORDER — SUMATRIPTAN SUCCINATE 50 MG PO TABS
50.0000 mg | ORAL_TABLET | Freq: Once | ORAL | Status: AC | PRN
  Administered 2019-08-19: 50 mg via ORAL
  Filled 2019-08-19: qty 1

## 2019-08-19 MED ORDER — VILAZODONE HCL 20 MG PO TABS
40.0000 mg | ORAL_TABLET | Freq: Every day | ORAL | Status: DC
  Administered 2019-08-20 – 2019-08-21 (×2): 40 mg via ORAL
  Filled 2019-08-19 (×4): qty 2

## 2019-08-19 MED ORDER — BUPROPION HCL ER (XL) 150 MG PO TB24
150.0000 mg | ORAL_TABLET | Freq: Every day | ORAL | Status: DC
  Administered 2019-08-19: 150 mg via ORAL
  Filled 2019-08-19: qty 1

## 2019-08-19 MED ORDER — HYDROXYZINE HCL 25 MG PO TABS
25.0000 mg | ORAL_TABLET | Freq: Three times a day (TID) | ORAL | Status: DC | PRN
  Administered 2019-08-20: 12:00:00 25 mg via ORAL
  Filled 2019-08-19 (×2): qty 1

## 2019-08-19 MED ORDER — VILAZODONE HCL 10 MG PO TABS
40.0000 mg | ORAL_TABLET | Freq: Every day | ORAL | Status: DC
  Filled 2019-08-19: qty 4

## 2019-08-19 MED ORDER — MAGNESIUM HYDROXIDE 400 MG/5ML PO SUSP: 30.0000 mL | Freq: Every day | ORAL | Status: DC | PRN

## 2019-08-19 MED ORDER — FOLIC ACID 1 MG PO TABS
1.0000 mg | ORAL_TABLET | Freq: Every day | ORAL | Status: DC
  Administered 2019-08-19 – 2019-08-21 (×3): 1 mg via ORAL
  Filled 2019-08-19 (×6): qty 1

## 2019-08-19 MED ORDER — ZIPRASIDONE MESYLATE 20 MG IM SOLR
10.0000 mg | Freq: Once | INTRAMUSCULAR | Status: DC
  Filled 2019-08-19: qty 20

## 2019-08-19 MED ORDER — THIAMINE HCL 100 MG PO TABS
100.0000 mg | ORAL_TABLET | Freq: Every day | ORAL | Status: DC
  Filled 2019-08-19: qty 1

## 2019-08-19 MED ORDER — TRAZODONE HCL 50 MG PO TABS
50.0000 mg | ORAL_TABLET | Freq: Every evening | ORAL | Status: DC | PRN
  Administered 2019-08-20: 22:00:00 50 mg via ORAL
  Filled 2019-08-19 (×2): qty 1

## 2019-08-19 MED ORDER — LORAZEPAM 1 MG PO TABS: 1.0000 mg | ORAL_TABLET | Freq: Four times a day (QID) | ORAL | Status: DC | PRN

## 2019-08-19 MED ORDER — THIAMINE HCL 100 MG/ML IJ SOLN: 100.0000 mg | Freq: Every day | INTRAMUSCULAR | Status: DC

## 2019-08-19 MED ORDER — ALUM & MAG HYDROXIDE-SIMETH 200-200-20 MG/5ML PO SUSP: 30.0000 mL | ORAL | Status: DC | PRN

## 2019-08-19 MED ORDER — LISDEXAMFETAMINE DIMESYLATE 30 MG PO CAPS
30.0000 mg | ORAL_CAPSULE | Freq: Every day | ORAL | Status: DC
  Administered 2019-08-19: 30 mg via ORAL
  Filled 2019-08-19: qty 1

## 2019-08-19 MED ORDER — BUPROPION HCL ER (XL) 150 MG PO TB24
150.0000 mg | ORAL_TABLET | Freq: Every day | ORAL | Status: DC
  Administered 2019-08-20 – 2019-08-21 (×2): 150 mg via ORAL
  Filled 2019-08-19 (×4): qty 1

## 2019-08-19 MED ORDER — VILAZODONE HCL 20 MG PO TABS
40.0000 mg | ORAL_TABLET | Freq: Every day | ORAL | Status: DC
  Administered 2019-08-19: 40 mg via ORAL
  Filled 2019-08-19: qty 2

## 2019-08-19 MED ORDER — STERILE WATER FOR INJECTION IJ SOLN
INTRAMUSCULAR | Status: AC
  Filled 2019-08-19: qty 10

## 2019-08-19 MED ORDER — LISDEXAMFETAMINE DIMESYLATE 20 MG PO CAPS: 20.0000 mg | ORAL_CAPSULE | Freq: Every day | ORAL | Status: DC

## 2019-08-19 NOTE — Progress Notes (Signed)
08/19/2019  0940  Called [facility] [phone] gave report to [place].

## 2019-08-19 NOTE — Progress Notes (Signed)
Psychoeducational Group Note  Date:  08/19/2019 Time:  2030  Group Topic/Focus:  wrap up group  Participation Level: Did Not Attend  Participation Quality:  Not Applicable  Affect:  Not Applicable  Cognitive:  Not Applicable  Insight:  Not Applicable  Engagement in Group: Not Applicable  Additional Comments:  Pt was notified that group was beginning but remained in bed.   Marcille Buffy 08/19/2019, 9:43 PM

## 2019-08-19 NOTE — ED Notes (Signed)
Refused to give next of kin.
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
## 2019-08-19 NOTE — BH Assessment (Signed)
Tele Assessment Note   Patient Name: Robert Salazar MRN: [id number] Referring Physician: Dierdre Forth, PA-C Location of Patient: Robert Salazar Location of Provider: Behavioral Health TTS Department  [name] is an 34 y.o. male who presents to the ED under IVC initiated by his wife. Per IVC, "respondent attempted to cut his wrist with a box cutter and then a knife. He was in the floor naked. He was foaming at the mouth." Pt is vague during TTS assessment. Pt states he is in the ED "I guess because I was a danger to self and others." Pt states he does not recall the incidents leading up to him coming to the ED. Pt states he was drinking heavily which he does everyday. Pt states he has no desire to stop or decrease alcohol consumption at this time. Pt is laughing at inappropriate times throughout the assessment including when he is asked if he has ever attempted suicide. Pt states "that's not a fair question because everybody thinks about killing themselves at least once a day." Pt admits he has been suicidal in the past but is vague in regards to any previous plans or intent. Pt states his appetite and sleeping habits "have always been shitty." Pt states he does not want TTS to contact his wife in order to obtain collateral information because "she's the whole reason I'm here."  TTS explained the need to contact the petitioner in order to obtain collateral information. TTS spoke with the pt's wife Robert Salazar, [phone], who states the pt has made threats of suicide about 5 years. Pt has been hospitalized for MH needs in the past but not sure where. Wife states the pt has not been taking his meds and they have been having marital problems which she feels may be attributing to his current state of mental health.   Per Nira Conn, NP pt recommended for continued observation for safety and stabilization and to be reassessed in the AM by psych.   Diagnosis: Bipolar d/o, current episode  depressed; Alcohol use d/o, severe  Past Medical History:  Past Medical History:  Diagnosis Date  . ADHD   . Bipolar 1 disorder (HCC)   . Depression     Past Surgical History:  Procedure Laterality Date  . VARICOCELECTOMY      Family History:  Family History  Problem Relation Age of Onset  . Celiac disease Sister   . Cancer Maternal Grandmother        unknown to pt  . Cancer Paternal Grandfather        lung  . Diverticulitis Mother   . Other Mother        trigiminal neuralgia  . Non-Hodgkin's lymphoma Father     Social History:  reports that he quit smoking about 5 years ago. His smoking use included cigarettes. He has a 10.00 pack-year smoking history. He has never used smokeless tobacco. He reports current alcohol use of about 10.0 standard drinks of alcohol per week. He reports that he does not use drugs.  Additional Social History:  Alcohol / Drug Use Pain Medications: See MAR Prescriptions: See MAR Over the Counter: See MAR History of alcohol / drug use?: Yes Longest period of sobriety (when/how long): did not disclose Negative Consequences of Use: Personal relationships Withdrawal Symptoms: Irritability, Patient aware of relationship between substance abuse and physical/medical complications, Blackouts Substance #1 Name of Substance 1: Alcohol 1 - Age of First Use: 24 1 - Amount (size/oz): excessive 1 - Frequency: daily 1 -  Duration: ongoing 1 - Last Use / Amount: 08/18/19  CIWA: CIWA-Ar BP: 118/75 Pulse Rate: 92 COWS:    Allergies: No Known Allergies  Home Medications: (Not in a hospital admission)   OB/GYN Status:  No LMP for male patient.  General Assessment Data Location of Assessment: [facility] ED TTS Assessment: In system Is this a Tele or Face-to-Face Assessment?: Tele Assessment Is this an Initial Assessment or a Re-assessment for this encounter?: Initial Assessment Patient Accompanied by:: N/A Language Other than English: No Living  Arrangements: Other (Comment) What gender do you identify as?: Male Marital status: Married Pregnancy Status: No Living Arrangements: Spouse/significant other, Children Can pt return to current living arrangement?: Yes Admission Status: Involuntary Petitioner: Family member Is patient capable of signing voluntary admission?: No Referral Source: Self/Family/Friend Insurance type: [facility]     Crisis Care Plan Living Arrangements: Spouse/significant other, Children Name of Psychiatrist: none Name of Therapist: none  Education Status Is patient currently in school?: No Is the patient employed, unemployed or receiving disability?: Employed  Risk to self with the past 6 months Suicidal Ideation: No-Not Currently/Within Last 6 Months Has patient been a risk to self within the past 6 months prior to admission? : Yes Suicidal Intent: No Has patient had any suicidal intent within the past 6 months prior to admission? : No Is patient at risk for suicide?: Yes Suicidal Plan?: No Has patient had any suicidal plan within the past 6 months prior to admission? : No Access to Means: No What has been your use of drugs/alcohol within the last 12 months?: alcohol Previous Attempts/Gestures: No Triggers for Past Attempts: None known Intentional Self Injurious Behavior: None Family Suicide History: No Recent stressful life event(s): Other (Comment)(heavy alcohol use) Persecutory voices/beliefs?: No Depression: Yes Depression Symptoms: Feeling angry/irritable, Despondent, Insomnia Substance abuse history and/or treatment for substance abuse?: Yes Suicide prevention information given to non-admitted patients: Not applicable  Risk to Others within the past 6 months Homicidal Ideation: No Does patient have any lifetime risk of violence toward others beyond the six months prior to admission? : No Thoughts of Harm to Others: No Current Homicidal Intent: No Current Homicidal Plan: No Access to  Homicidal Means: No History of harm to others?: No Assessment of Violence: None Noted Does patient have access to weapons?: No Criminal Charges Pending?: No Does patient have a court date: No Is patient on probation?: No  Psychosis Hallucinations: None noted Delusions: None noted  Mental Status Report Appearance/Hygiene: Disheveled Eye Contact: Poor Motor Activity: Restlessness Speech: Pressured Level of Consciousness: Alert Mood: Labile Affect: Constricted, Preoccupied Anxiety Level: None Thought Processes: Relevant, Coherent Judgement: Impaired Orientation: Place, Person, Time Obsessive Compulsive Thoughts/Behaviors: None  Cognitive Functioning Concentration: Normal Memory: Recent Intact, Recent Impaired Is patient IDD: No Insight: Poor Impulse Control: Poor Appetite: Poor Have you had any weight changes? : No Change Sleep: Decreased Total Hours of Sleep: 5 Vegetative Symptoms: None  ADLScreening [facility] Assessment Services) Patient's cognitive ability adequate to safely complete daily activities?: Yes Patient able to express need for assistance with ADLs?: Yes Independently performs ADLs?: Yes (appropriate for developmental age)  Prior Inpatient Therapy Prior Inpatient Therapy: No  Prior Outpatient Therapy Prior Outpatient Therapy: Yes Prior Therapy Dates: 2020 Prior Therapy Facilty/Provider(s): [facility] Reason for Treatment: Bipolar Does patient have an ACCT team?: No Does patient have Intensive In-House Services?  : No Does patient have [facility] services? : No Does patient have P4CC services?: No  ADL Screening (condition at time of admission) Patient's cognitive  ability adequate to safely complete daily activities?: Yes Is the patient deaf or have difficulty hearing?: No Does the patient have difficulty seeing, even when wearing glasses/contacts?: No Does the patient have difficulty concentrating, remembering, or making decisions?: Yes Patient able  to express need for assistance with ADLs?: Yes Does the patient have difficulty dressing or bathing?: No Independently performs ADLs?: Yes (appropriate for developmental age) Does the patient have difficulty walking or climbing stairs?: No Weakness of Legs: None Weakness of Arms/Hands: None  Home Assistive Devices/Equipment Home Assistive Devices/Equipment: None    Abuse/Neglect Assessment (Assessment to be complete while patient is alone) Abuse/Neglect Assessment Can Be Completed: Yes Physical Abuse: Denies Verbal Abuse: Denies Sexual Abuse: Denies Exploitation of patient/patient's resources: Denies Self-Neglect: Denies     Regulatory affairs officer (For Healthcare) Does Patient Have a Medical Advance Directive?: No Would patient like information on creating a medical advance directive?: No - Patient declined          Disposition: Per Lindon Romp, NP pt recommended for continued observation for safety and stabilization and to be reassessed in the AM by psych.   Disposition Initial Assessment Completed for this Encounter: Yes Disposition of Patient: (continued OBS) Patient refused recommended treatment: No  This service was provided via telemedicine using a 2-way, interactive audio and video technology.  Names of all persons participating in this telemedicine service and their role in this encounter. Name: DAVIEN MALONE Role: Patient  Name: Lind Covert Role: TTS          Lyanne Co 08/19/2019 5:04 AM

## 2019-08-19 NOTE — Progress Notes (Signed)
08/19/2019  0943  Called police [phone] to transport patient to [facility].

## 2019-08-19 NOTE — Tx Team (Signed)
Initial Treatment Plan 08/19/2019 2:10 PM ROTH RESS CLE:[id number]    PATIENT STRESSORS: Marital or family conflict Substance abuse   PATIENT STRENGTHS: Average or above average intelligence Capable of independent living General fund of knowledge Physical Health Work skills   PATIENT IDENTIFIED PROBLEMS: "everybodies lack of critical thinking skills"  "state of the world"                   DISCHARGE CRITERIA:  Ability to meet basic life and health needs Adequate post-discharge living arrangements Improved stabilization in mood, thinking, and/or behavior Need for constant or close observation no longer present Reduction of life-threatening or endangering symptoms to within safe limits Safe-care adequate arrangements made Verbal commitment to aftercare and medication compliance Withdrawal symptoms are absent or subacute and managed without 24-hour nursing intervention  PRELIMINARY DISCHARGE PLAN: Outpatient therapy Return to previous living arrangement Return to previous work or school arrangements  PATIENT/FAMILY INVOLVEMENT: This treatment plan has been presented to and reviewed with the patient, Robert Salazar, and/or family member, .  The patient and family have been given the opportunity to ask questions and make suggestions.  Beatrix Shipper, RN 08/19/2019, 2:10 PM

## 2019-08-19 NOTE — Progress Notes (Signed)
Per Katha Cabal, pt has been accepted to [facility]. Bed number is 400-01. Accepting provider is  Hillery Jacks, NP. Attending provider will be Dr. Lucianne Muss. Number for report is [phone]. The pt may arrive as soon as transport has been set up.    Ruthann Cancer MSW, [facility] Social Worker Disposition  [facility] Ph: [phone] Fax: [phone]  08/19/2019 9:36 AM

## 2019-08-19 NOTE — ED Triage Notes (Signed)
Received Robert Salazar via Doctor, general practice with EMS, alert and in restraints. The IVC paperwork was placed in his chart. Pt stated he was arrested because he is a danger to himself and others. Stated he had a Covid test at [facility] hospital 3 days ago with a negative result. He refused blood work and urine specimen. Stated he had a six pack of beer and 2 shots tonight before this admission.  At 0241 hrs he inquired about his IVC status and then attempted to leave. Security and the male technicians  stopped him from leaving. After speaking with the PA, he agreed to blood work and giving a urine specimen.

## 2019-08-19 NOTE — Progress Notes (Signed)
This Clinical research associate notified by [facility] that pt didn't have a Covid test performed before admission to the unit today. Pt informed that another Covid test was required. Pt consented to another test. Pt's roommate was moved to another room. Pt told to remain in room until test has resulted. Test performed and sent off to [facility] for analysis.

## 2019-08-19 NOTE — Progress Notes (Signed)
   08/19/19 2053  Psych Admission Type (Psych Patients Only)  Admission Status Involuntary  Psychosocial Assessment  Patient Complaints Depression  Eye Contact Fair  Facial Expression Flat  Affect Depressed  Speech Logical/coherent  Interaction Assertive  Motor Activity Other (Comment) (WNL)  Appearance/Hygiene Unremarkable  Mood Depressed  Thought Process  Coherency WDL  Content Ambivalence  Delusions None reported or observed  Perception WDL  Hallucination Auditory;Visual (says he has always had them; non commanding)  Judgment Poor  Confusion None  Danger to Self  Current suicidal ideation? Denies  Self-Injurious Behavior No self-injurious ideation or behavior indicators observed or expressed   Danger to Others  Danger to Others None reported or observed   Pt in bed. Denies SI, HI. Endorses AVH that are non-commanding. Says he hears and sees things "no more than usual. Have had them since childhood." Pt endorses pain 3-4/10 in wrists from the handcuffs. Pt given Tylenol.

## 2019-08-19 NOTE — Progress Notes (Signed)
Per Nira Conn, NP pt recommended for continued observation for safety and stabilization and to be reassessed in the AM by psych. EDP Muthersbaugh, Boyd Kerbs and pt's nurse Rex Kras, RN have been advised.

## 2019-08-19 NOTE — Progress Notes (Signed)
Pt admitted IVC from [facility] after making a suicide threat to cut self with a box cutter while impaired. Pt does not remember what happened and says he was told that he was lying on the floor naked foaming at the mouth. Pt reports two prior suicide attempts by drinking rubbing alcohol and nail polish remover. Pt's grandfather completed suicide. Pt lives with his wife and four year old son. He reports that he drinks a 12 pack daily. Pt denies hi and contracts for safety on admission.

## 2019-08-19 NOTE — Patient Outreach (Signed)
CPSS met with Pt at [facility] and was able to use Motivational  interviewing to gain information to better assist Pt  CPSS made Pt aware that Dave spoke with his someone from his job an informed them that Pt will not be able to work for a few days. CPSS was made aware that Pt has been dealing with Mental Health issues since a child. Pt mentioned that he has his medications but is not take them. CPSS addressed the fact also that he has not been able to attend the any group therapy because of insurance purposes. CPSS processed with Pt an made Pt aware that there are other group options that Pt can look into getting involved in. CPSS left contact information for Pt to stay in contact for community support also.   

## 2019-08-19 NOTE — ED Provider Notes (Signed)
[facility]-EMERGENCY DEPT Provider Note   CSN: [id number] Arrival date & time: 08/19/19  0121     History Chief Complaint  Patient presents with  . Suicidal    Homocidal     Robert Salazar is a 34 y.o. male with a hx of Polar disorder, depression, anxiety disorder presents to the Emergency Department under IVC.  IVC paperwork taken out by patient's wife which states that patient attempted to slit his wrist with a box cutter tonight and that he was running around naked.  Patient is agitated, attempting to leave.  He refuses to answer my questions.  He does admit to drinking 12+ beers every night including tonight with the addition of liquor.  He denies drug use.  He provides no additional information.     Level 5 CAVEAT for psychiatric illness  The history is provided by the patient and medical records. No language interpreter was used.       Past Medical History:  Diagnosis Date  . ADHD   . Bipolar 1 disorder (HCC)   . Depression     Patient Active Problem List   Diagnosis Date Noted  . Hearing loss of left ear 01/24/2019  . Elevated LDL cholesterol level 12/28/2018  . Insect bite of left front wall of thorax 12/15/2018  . Healthcare maintenance 08/30/2018  . Bipolar 1 disorder (HCC) 08/30/2018  . Depression, recurrent (HCC) 08/30/2018  . GAD (generalized anxiety disorder) 08/30/2018    Past Surgical History:  Procedure Laterality Date  . VARICOCELECTOMY         Family History  Problem Relation Age of Onset  . Celiac disease Sister   . Cancer Maternal Grandmother        unknown to pt  . Cancer Paternal Grandfather        lung  . Diverticulitis Mother   . Other Mother        trigiminal neuralgia  . Non-Hodgkin's lymphoma Father     Social History   Tobacco Use  . Smoking status: Former Smoker    Packs/day: 1.00    Years: 10.00    Pack years: 10.00    Types: Cigarettes    Quit date: 07/20/2014    Years since quitting: 5.0  .  Smokeless tobacco: Never Used  Substance Use Topics  . Alcohol use: Yes    Alcohol/week: 10.0 standard drinks    Types: 5 Glasses of wine, 5 Cans of beer per week    Comment: case of beer a week  . Drug use: Never    Home Medications Prior to Admission medications   Medication Sig Start Date End Date Taking? Authorizing Provider  acetaminophen (TYLENOL) 500 MG tablet Take 500 mg by mouth every 6 (six) hours as needed for headache.    [provider]  buPROPion (WELLBUTRIN XL) 150 MG 24 hr tablet  05/23/19   [provider]  ibuprofen (ADVIL) 200 MG tablet Take 600 mg by mouth once as needed for headache.    [provider]  lisdexamfetamine (VYVANSE) 50 MG capsule Take 50 mg by mouth daily.    [provider]  rizatriptan (MAXALT-MLT) 10 MG disintegrating tablet Take 1 tablet earliest onset of migraine.  May repeat in 2 hours if needed.  Maximum 2 tablets in 24 hours 02/06/19   Jaffe, Adam R, DO  VIIBRYD 40 MG TABS Take 1 tablet by mouth daily. 07/27/18   [provider]    Allergies    Patient  has no known allergies.  Review of Systems   Review of Systems  Unable to perform ROS: Psychiatric disorder    Physical Exam Updated Vital Signs BP 118/75 (BP Location: Left Arm)   Pulse 92   Temp 98.5 F (36.9 C) (Oral)   Resp 18   Ht 6' (1.829 m)   Wt 68 kg   SpO2 95%   BMI 20.34 kg/m   Physical Exam Vitals and nursing note reviewed.  Constitutional:      General: He is not in acute distress.    Appearance: He is well-developed.  HENT:     Head: Normocephalic.  Eyes:     General: No scleral icterus.    Conjunctiva/sclera: Conjunctivae normal.  Cardiovascular:     Rate and Rhythm: Normal rate.  Pulmonary:     Effort: Pulmonary effort is normal.     Breath sounds: Normal breath sounds.  Abdominal:     Palpations: Abdomen is soft.     Tenderness: There is no abdominal tenderness.  Musculoskeletal:        General: Normal range  of motion.     Cervical back: Normal range of motion.  Skin:    General: Skin is warm and dry.  Neurological:     Mental Status: He is alert.  Psychiatric:        Mood and Affect: Mood is anxious.        Speech: Speech is rapid and pressured.        Behavior: Behavior is agitated.     ED Results / Procedures / Treatments   Labs (all labs ordered are listed, but only abnormal results are displayed) Labs Reviewed  COMPREHENSIVE METABOLIC PANEL - Abnormal; Notable for the following components:      Result Value   Glucose, Bld 117 (*)    Total Protein 8.4 (*)    All other components within normal limits  ETHANOL - Abnormal; Notable for the following components:   Alcohol, Ethyl (B) 192 (*)    All other components within normal limits  SALICYLATE LEVEL - Abnormal; Notable for the following components:   Salicylate Lvl <7.0 (*)    All other components within normal limits  ACETAMINOPHEN LEVEL - Abnormal; Notable for the following components:   Acetaminophen (Tylenol), Serum <10 (*)    All other components within normal limits  CBC - Abnormal; Notable for the following components:   WBC 11.8 (*)    All other components within normal limits  RESPIRATORY PANEL BY RT PCR (FLU A&B, COVID)  RAPID URINE DRUG SCREEN, HOSP PERFORMED     Procedures Procedures (including critical care time)  Medications Ordered in ED Medications  sterile water (preservative free) injection (  Not Given 08/19/19 0300)    ED Course  I have reviewed the triage vital signs and the nursing notes.  Pertinent labs & imaging results that were available during my care of the patient were reviewed by me and considered in my medical decision making (see chart for details).    MDM Rules/Calculators/A&P                       Patient presents under IVC.  He is unwilling to participate in history taking.  Does admit to alcohol usage.  Will not discuss events of tonight.  Mild leukocytosis.  Labs otherwise  reassuring.  Elevated alcohol at 192.  Patient does not need urgent medical intervention.  TTS pending.  5:06 AM Princess Bruins: Per  Lindon Romp, NP pt recommended for continued observation for safety and stabilization and to be reassessed in the AM by psych. First exam completed.     Final Clinical Impression(s) / ED Diagnoses Final diagnoses:  Involuntary commitment    Rx / DC Orders ED Discharge Orders    None       Athel Merriweather, Gwenlyn Perking 08/19/19 0507    Ripley Fraise, MD 08/19/19 [phone]

## 2019-08-20 DIAGNOSIS — F1020 Alcohol dependence, uncomplicated: Secondary | ICD-10-CM

## 2019-08-20 LAB — RESPIRATORY PANEL BY RT PCR (FLU A&B, COVID)
Influenza A by PCR: NEGATIVE
Influenza B by PCR: NEGATIVE
SARS Coronavirus 2 by RT PCR: NEGATIVE

## 2019-08-20 MED ORDER — IBUPROFEN 600 MG PO TABS
600.0000 mg | ORAL_TABLET | Freq: Three times a day (TID) | ORAL | Status: DC | PRN
  Administered 2019-08-20 – 2019-08-21 (×2): 600 mg via ORAL
  Filled 2019-08-20 (×3): qty 1
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
## 2019-08-20 NOTE — Progress Notes (Addendum)
Patient visible in the milieu. Patient pleasant and cooperative on approach. Pt endorses intermittent suicidal ideation. Patient denies any plan. Patient states "I never have a plan". Patient states his 35 year old son is his reasoning for not killing himself. States " my mother had to deal with her father committing suicide as a child and I know how she felt so I would never want my son to feel like that". Patient endorses AVH but states he knows how to differentiate reality from his hallucinations " its like they are behind a glass door". Patient states he has a psychotic break every 10 years. Denies any command hallucinations. 6+5 Patient verbally contracts for safety. Will continue to assess for changes.

## 2019-08-20 NOTE — [facility] Group Notes (Signed)
[facility] LCSW Group Therapy Note  Date/Time:  08/20/2019 9:00-10:00 or 10:00-11:00AM  Type of Therapy and Topic:  Group Therapy:  Healthy and Unhealthy Supports  Participation Level:  Active   Description of Group:  Patients in this group were introduced to the idea of adding a variety of healthy supports to address the various needs in their lives.Patients discussed what additional healthy supports could be helpful in their recovery and wellness after discharge in order to prevent future hospitalizations.   An emphasis was placed on using counselor, doctor, therapy groups, 12-step groups, and problem-specific support groups to expand supports.  They also worked as a group on developing a specific plan for several patients to deal with unhealthy supports through boundary-setting, psychoeducation with loved ones, and even termination of relationships.   Therapeutic Goals:   1)  discuss importance of adding supports to stay well once out of the hospital  2)  compare healthy versus unhealthy supports and identify some examples of each  3)  generate ideas and descriptions of healthy supports that can be added  4)  offer mutual support about how to address unhealthy supports  5)  encourage active participation in and adherence to discharge plan    Summary of Patient Progress:  The patient stated that current healthy supports in his life are illegal drugs while current unhealthy supports also  include include illegal drugs. The patient explained that psychodelic drugs have benefits while cocaine is negative.  The patient expressed that he feels that inpatient stays should be longer to allow patients better chances at success and expressed frustration with the charges he has to pay to receive treatment even he is is unable to do so.  Therapeutic Modalities:   Motivational Interviewing Brief Solution-Focused Therapy  Evorn Gong

## 2019-08-20 NOTE — Progress Notes (Signed)
Pt is Covid negative per result review.

## 2019-08-20 NOTE — Progress Notes (Signed)
   08/20/19 2247  Psych Admission Type (Psych Patients Only)  Admission Status Involuntary  Psychosocial Assessment  Patient Complaints Depression;Insomnia  Eye Contact Fair  Facial Expression Flat  Affect Depressed  Speech Logical/coherent  Interaction Assertive  Motor Activity Other (Comment) (WNL)  Appearance/Hygiene Unremarkable  Behavior Characteristics Cooperative;Anxious  Mood Depressed  Thought Process  Coherency WDL  Content Ambivalence  Delusions None reported or observed  Perception WDL  Hallucination Auditory;Visual (non commanding; "no more than usual")  Judgment Poor  Confusion None  Danger to Self  Current suicidal ideation? Denies  Self-Injurious Behavior No self-injurious ideation or behavior indicators observed or expressed   Agreement Not to Harm Self Yes  Danger to Others  Danger to Others None reported or observed   Pt denies SI, HI and pain. Pt endorses AVH and says "its no more than what I normally have." Pt endorses insomnia and asked for something for sleep. Pt given Trazodone 50 mg. Understands possible side effects of medication. Contracts for safety.

## 2019-08-20 NOTE — Progress Notes (Signed)
[facility] Group Notes:  (Nursing/MHT/Case Management/Adjunct)  Date:  08/20/2019  Time:  2030  Type of Therapy:  wrap up group  Participation Level:  Active  Participation Quality:  Appropriate, Attentive, Sharing and Supportive  Affect:  Blunted  Cognitive:  Alert  Insight:  Improving  Engagement in Group:  Engaged  Modes of Intervention:  Clarification, Education and Support  Summary of Progress/Problems: Positive change and positive thinking was discussed.  Marcille Buffy 08/20/2019, 11:01 PM

## 2019-08-20 NOTE — [facility] Suicide Risk Assessment (Signed)
[facility] INPATIENT:  Family/Significant Other Suicide Prevention Education  Suicide Prevention Education:  Education Completed; wife Robert Salazar [phone],  (name of family member/significant other) has been identified by the patient as the family member/significant other with whom the patient will be residing, and identified as the person(s) who will aid the patient in the event of a mental health crisis (suicidal ideations/suicide attempt).  With written consent from the patient, the family member/significant other has been provided the following suicide prevention education, prior to the and/or following the discharge of the patient.  Wife states that patient is aware that parents called 911 and is not happy with them about that.  Wife is afraid he will find out she is the one who did the IVC paperwork.  She is in the process of considering where to go from here, and was advised if there is any unwelcome news to be given to patient, it is best that it be given while he is in the hospital.  She asked numerous questions, was satisfied with the answers.  CSW could not answer two questions, whether doctor considers this to be a psychotic episode and whether the patient foaming at the mouth could have been caused by alcohol.  She assured that there are no guns in the home.      The suicide prevention education provided includes the following:  Suicide risk factors  Suicide prevention and interventions  National Suicide Hotline telephone number  [facility] assessment telephone number  [facility] Emergency Assistance 911  [facility] and/or Residential Mobile Crisis Unit telephone number  Request made of family/significant other to:  Remove weapons (e.g., guns, rifles, knives), all items previously/currently identified as safety concern.    Remove drugs/medications (over-the-counter, prescriptions, illicit drugs), all items previously/currently identified as a safety  concern.  The family member/significant other verbalizes understanding of the suicide prevention education information provided.  The family member/significant other agrees to remove the items of safety concern listed above.  Carloyn Jaeger Grossman-Orr 08/20/2019, 10:31 AM
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
## 2019-08-20 NOTE — [facility] Group Notes (Signed)
Adult Psychoeducational Group Note  Date:  08/20/2019 Time:  3:17 PM  Group Topic/Focus:  Life Skills  Participation Level:  Active  Participation Quality:  Appropriate  Affect:  Appropriate  Cognitive:  Appropriate  Insight: Good  Engagement in Group:  Engaged  Modes of Intervention:  Discussion  Additional Comments:  Pt rates his energy level at a 3. States, "I am here against my will." Was able to participate and interact with his peers in the group. Shared with the group that he gets sick every 10 years. This is the third time he has actually been hospitalized. Did respond and interact well in the group  Dione Housekeeper 08/20/2019, 3:17 PM

## 2019-08-20 NOTE — [facility] Group Notes (Signed)
Adult Psychoeducational Group Note  Date:  08/20/2019 Time:  11:21 AM  Group Topic/Focus:  Progressive Relaxation  Participation Level:  Minimal  Participation Quality:  Inattentive  Affect:  Defensive  Cognitive:  Oriented  Insight: Lacking  Engagement in Group:  Resistant  Modes of Intervention:  Activity and Support  Additional Comments:  Participated in the group, but stated all the groups, in all the hospitals are the same. Pt rated his energy at a 5. Stated that his source of support "could be animals. Depends on the severity of the issue. It could also be the family6, music and just being alone"  Dione Housekeeper 08/20/2019, 11:21 AM
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
## 2019-08-21 DIAGNOSIS — F1024 Alcohol dependence with alcohol-induced mood disorder: Secondary | ICD-10-CM

## 2019-08-21 MED ORDER — BUPROPION HCL ER (XL) 150 MG PO TB24: 150.0000 mg | ORAL_TABLET | Freq: Every day | ORAL | 0 refills | Status: DC

## 2019-08-21 MED ORDER — VIIBRYD 20 MG PO TABS: 40.0000 mg | ORAL_TABLET | Freq: Every day | ORAL | 0 refills | Status: DC

## 2019-08-21 NOTE — Progress Notes (Signed)
Recreation Therapy Notes  Date:  2.1.21 Time: 0930 Location: [address] Dayroom  Group Topic: Stress Management  Goal Area(s) Addresses:  Patient will identify positive stress management techniques. Patient will identify benefits of using stress management post d/c.  Behavioral Response:  Engaged  Intervention: Worksheet  Activity :  Meditation.  LRT played a meditation that focused on resilience.  Patients were to listen and follow along as meditation played to fully engage in activity.    Education:  Stress Management, Discharge Planning.   Education Outcome: Acknowledges Education  Clinical Observations/Feedback:  Pt attended group session.     Caillou Minus Lillia Abed LRT/CTRS         Caroll Rancher A 08/21/2019 12:35 PM

## 2019-08-21 NOTE — Progress Notes (Signed)
D:  Patient denied SI and HI, contracts for safety.  Patient stated he did hear voices last night and also visual hallucinations.  This is usual for him, but he has adjusted to these hallucinations.    A:  Medications administered per MD orders.  Emotional support and encouragement given patient. R:  Safety maintained with 15 minute checks.

## 2019-08-21 NOTE — Progress Notes (Signed)
Spiritual care group on grief and loss facilitated by chaplain Jena Tegeler MDiv, [facility]  Group Goal:  Support / Education around grief and loss Members engage in facilitated group support and psycho-social education.  Group Description:  Following introductions and group rules, group members engaged in facilitated group dialog and support around topic of loss, with particular support around experiences of loss in their lives. Group Identified types of loss (relationships / self / things) and identified patterns, circumstances, and changes that precipitate losses. Reflected on thoughts / feelings around loss, normalized grief responses, and recognized variety in grief experience.   Group noted Worden's four tasks of grief in discussion.  Group drew on Adlerian / Rogerian, narrative, MI, Patient Progress:  DID NOT ATTEND  
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
## 2019-08-21 NOTE — BHH Suicide Risk Assessment (Signed)
[facility] Discharge Suicide Risk Assessment   Principal Problem: Alcohol use disorder, severe, dependence (HCC) Discharge Diagnoses: Principal Problem:   Alcohol use disorder, severe, dependence (HCC) Active Problems:   Bipolar 1 disorder (HCC)   MDD (major depressive disorder)   Total Time spent with patient: 20 minutes  Musculoskeletal: Strength & Muscle Tone: within normal limits Gait & Station: normal Patient leans: N/A  Psychiatric Specialty Exam: Review of Systems  All other systems reviewed and are negative.   Blood pressure 116/77, pulse (!) 56, temperature 97.9 F (36.6 C), resp. rate 18, height 6' (1.829 m), weight 68 kg, SpO2 100 %.Body mass index is 20.34 kg/m.  General Appearance: Casual  Eye Contact::  Fair  Speech:  Normal Rate409  Volume:  Normal  Mood:  Anxious  Affect:  Congruent  Thought Process:  Coherent and Descriptions of Associations: Intact  Orientation:  Full (Time, Place, and Person)  Thought Content:  Logical  Suicidal Thoughts:  No  Homicidal Thoughts:  No  Memory:  Immediate;   Good Recent;   Good Remote;   Good  Judgement:  Intact  Insight:  Lacking  Psychomotor Activity:  Normal  Concentration:  Fair  Recall:  Fair  Fund of Knowledge:Good  Language: Good  Akathisia:  Negative  Handed:  Right  AIMS (if indicated):     Assets:  Communication Skills Desire for Improvement Housing Resilience Talents/Skills Transportation Vocational/Educational  Sleep:  Number of Hours: 5.5  Cognition: WNL  ADL's:  Intact   Mental Status Per Nursing Assessment::   On Admission:  Suicidal ideation indicated by patient  Demographic Factors:  Male and Caucasian  Loss Factors: NA  Historical Factors: Impulsivity  Risk Reduction Factors:   Employed, Living with another person, especially a relative and Positive social support  Continued Clinical Symptoms:  Depression:   Comorbid alcohol abuse/dependence Impulsivity Alcohol/Substance  Abuse/Dependencies  Cognitive Features That Contribute To Risk:  None    Suicide Risk:  Minimal: No identifiable suicidal ideation.  Patients presenting with no risk factors but with morbid ruminations; may be classified as minimal risk based on the severity of the depressive symptoms  Follow-up Information    [facility]. Go on 08/23/2019.   Why: You have an appointment on 08/23/19 at 2:15.  This appointment will be held in person.  Please bring your insurance information and your hospital discharge with you.  Contact information: [address] [place] [address] [phone]          Plan Of Care/Follow-up recommendations:  Activity:  ad lib  Antonieta Pert, MD 08/21/2019, 10:21 AM

## 2019-08-21 NOTE — Progress Notes (Signed)
  [facility] Adult Case Management Discharge Plan :  Will you be returning to the same living situation after discharge:  No. Going to stay with parents. At discharge, do you have transportation home?: Yes,  parents picking up. Do you have the ability to pay for your medications: Yes,  [employer].  Release of information consent forms completed and in the chart.  Patient to Follow up at: Follow-up Information    [facility]. Go on 08/23/2019.   Why: You have an appointment on 08/23/19 at 2:15.  This appointment will be held in person.  Please bring your insurance information and your hospital discharge with you.  Contact information: [address] [place] [address] [phone]       [facility]AP[facility] [place]. Schedule an appointment as soon as possible for a visit.   Specialty: Behavioral Health Why: A referral has been made on your behalf.  The provider will contact you with an appointment. Contact information: [address] [id number] mc [place] [address] [phone]          Next level of care provider has access to [facility] Link:yes  Safety Planning and Suicide Prevention discussed: Yes,  with wife.  Have you used any form of tobacco in the last 30 days? (Cigarettes, Smokeless Tobacco, Cigars, and/or Pipes): Yes  Has patient been referred to the Quitline?: Patient refused referral  Patient has been referred for addiction treatment: Yes  Darreld Mclean, LCSWA 08/21/2019, 1:08 PM

## 2019-08-21 NOTE — Progress Notes (Signed)
Discharge Note:  Patient discharged home with family.  Patient denied SI and HI.  Denied A/V hallucinations.  Suicide prevention information given and discussed with patient who stated he understood and had no questions.  Patient stated he appreciated all assistance received from [facility] staff.  All required discharge information given to patient at discharge.  Patient did not have PJ's in belongings at discharge.  Several people looked in observations, admission room, individual rooms, of etc.   Patient given phone call to call about his pajama bottoms after discharge.

## 2019-08-21 NOTE — Tx Team (Signed)
Interdisciplinary Treatment and Diagnostic Plan Update  08/21/2019 Time of Session:  [name] MRN: [id number]  Principal Diagnosis: Alcohol use disorder, severe, dependence (HCC)  Secondary Diagnoses: Principal Problem:   Alcohol use disorder, severe, dependence (HCC) Active Problems:   Bipolar 1 disorder (HCC)   MDD (major depressive disorder)   Current Medications:  Current Facility-Administered Medications  Medication Dose Route Frequency Provider Last Rate Last Admin  . acetaminophen (TYLENOL) tablet 650 mg  650 mg Oral Q6H PRN Antonieta Pert, MD   650 mg at 08/21/19 [phone]  . alum & mag hydroxide-simeth (MAALOX/MYLANTA) 200-200-20 MG/5ML suspension 30 mL  30 mL Oral Q4H PRN Antonieta Pert, MD      . buPROPion (WELLBUTRIN XL) 24 hr tablet 150 mg  150 mg Oral Daily Antonieta Pert, MD   150 mg at 08/21/19 2025  . folic acid (FOLVITE) tablet 1 mg  1 mg Oral Daily Antonieta Pert, MD   1 mg at 08/21/19 4270  . hydrOXYzine (ATARAX/VISTARIL) tablet 25 mg  25 mg Oral TID PRN Antonieta Pert, MD   25 mg at 08/20/19 1151  . ibuprofen (ADVIL) tablet 600 mg  600 mg Oral Q8H PRN Armandina Stammer I, NP   600 mg at 08/20/19 1206  . LORazepam (ATIVAN) tablet 1 mg  1 mg Oral Q6H PRN Antonieta Pert, MD      . magnesium hydroxide (MILK OF MAGNESIA) suspension 30 mL  30 mL Oral Daily PRN Antonieta Pert, MD      . thiamine tablet 100 mg  100 mg Oral Daily Perlie Gold L, RPH   100 mg at 08/21/19 6237  . traZODone (DESYREL) tablet 50 mg  50 mg Oral QHS PRN Antonieta Pert, MD   50 mg at 08/20/19 2207  . Vilazodone HCl TABS 40 mg  40 mg Oral Daily Antonieta Pert, MD   40 mg at 08/21/19 6283   PTA Medications: Medications Prior to Admission  Medication Sig Dispense Refill Last Dose  . acetaminophen (TYLENOL) 500 MG tablet Take 500 mg by mouth every 6 (six) hours as needed for headache.     Marland Kitchen buPROPion (WELLBUTRIN XL) 300 MG 24 hr tablet Take 1 tablet by mouth daily.     Marland Kitchen  ibuprofen (ADVIL) 200 MG tablet Take 600 mg by mouth once as needed for headache.     . lisdexamfetamine (VYVANSE) 50 MG capsule Take 50 mg by mouth daily.     . rizatriptan (MAXALT-MLT) 10 MG disintegrating tablet Take 1 tablet earliest onset of migraine.  May repeat in 2 hours if needed.  Maximum 2 tablets in 24 hours 9 tablet 3   . VIIBRYD 40 MG TABS Take 1 tablet by mouth daily.       Patient Stressors: Marital or family conflict Substance abuse  Patient Strengths: Average or above average intelligence Capable of independent living General fund of knowledge Physical Health Work skills  Treatment Modalities: Medication Management, Group therapy, Case management,  1 to 1 session with clinician, Psychoeducation, Recreational therapy.   Physician Treatment Plan for Primary Diagnosis: Alcohol use disorder, severe, dependence (HCC) Long Term Goal(s): Improvement in symptoms so as ready for discharge Improvement in symptoms so as ready for discharge   Short Term Goals: Ability to identify changes in lifestyle to reduce recurrence of condition will improve Ability to verbalize feelings will improve Ability to disclose and discuss suicidal ideas Ability to demonstrate self-control will improve Ability to identify and  develop effective coping behaviors will improve Ability to maintain clinical measurements within normal limits will improve Compliance with prescribed medications will improve Ability to identify triggers associated with substance abuse/mental health issues will improve Ability to identify changes in lifestyle to reduce recurrence of condition will improve Ability to verbalize feelings will improve Ability to disclose and discuss suicidal ideas Ability to demonstrate self-control will improve Ability to identify and develop effective coping behaviors will improve Ability to maintain clinical measurements within normal limits will improve Compliance with prescribed  medications will improve Ability to identify triggers associated with substance abuse/mental health issues will improve  Medication Management: Evaluate patient's response, side effects, and tolerance of medication regimen.  Therapeutic Interventions: 1 to 1 sessions, Unit Group sessions and Medication administration.  Evaluation of Outcomes: Adequate for Discharge  Physician Treatment Plan for Secondary Diagnosis: Principal Problem:   Alcohol use disorder, severe, dependence (HCC) Active Problems:   Bipolar 1 disorder (HCC)   MDD (major depressive disorder)  Long Term Goal(s): Improvement in symptoms so as ready for discharge Improvement in symptoms so as ready for discharge   Short Term Goals: Ability to identify changes in lifestyle to reduce recurrence of condition will improve Ability to verbalize feelings will improve Ability to disclose and discuss suicidal ideas Ability to demonstrate self-control will improve Ability to identify and develop effective coping behaviors will improve Ability to maintain clinical measurements within normal limits will improve Compliance with prescribed medications will improve Ability to identify triggers associated with substance abuse/mental health issues will improve Ability to identify changes in lifestyle to reduce recurrence of condition will improve Ability to verbalize feelings will improve Ability to disclose and discuss suicidal ideas Ability to demonstrate self-control will improve Ability to identify and develop effective coping behaviors will improve Ability to maintain clinical measurements within normal limits will improve Compliance with prescribed medications will improve Ability to identify triggers associated with substance abuse/mental health issues will improve     Medication Management: Evaluate patient's response, side effects, and tolerance of medication regimen.  Therapeutic Interventions: 1 to 1 sessions, Unit Group  sessions and Medication administration.  Evaluation of Outcomes: Adequate for Discharge   RN Treatment Plan for Primary Diagnosis: Alcohol use disorder, severe, dependence (HCC) Long Term Goal(s): Knowledge of disease and therapeutic regimen to maintain health will improve  Short Term Goals: Ability to participate in decision making will improve, Ability to disclose and discuss suicidal ideas, Ability to identify and develop effective coping behaviors will improve and Compliance with prescribed medications will improve  Medication Management: RN will administer medications as ordered by provider, will assess and evaluate patient's response and provide education to patient for prescribed medication. RN will report any adverse and/or side effects to prescribing provider.  Therapeutic Interventions: 1 on 1 counseling sessions, Psychoeducation, Medication administration, Evaluate responses to treatment, Monitor vital signs and CBGs as ordered, Perform/monitor CIWA, COWS, AIMS and Fall Risk screenings as ordered, Perform wound care treatments as ordered.  Evaluation of Outcomes: Adequate for Discharge   LCSW Treatment Plan for Primary Diagnosis: Alcohol use disorder, severe, dependence (HCC) Long Term Goal(s): Safe transition to appropriate next level of care at discharge, Engage patient in therapeutic group addressing interpersonal concerns.  Short Term Goals: Engage patient in aftercare planning with referrals and resources  Therapeutic Interventions: Assess for all discharge needs, 1 to 1 time with Social worker, Explore available resources and support systems, Assess for adequacy in community support network, Educate family and significant other(s) on suicide prevention,  Complete Psychosocial Assessment, Interpersonal group therapy.  Evaluation of Outcomes: Adequate for Discharge   Progress in Treatment: Attending groups: Yes. Participating in groups: Yes. Taking medication as  prescribed: Yes. Toleration medication: Yes. Family/Significant other contact made: Yes, individual(s) contacted:  the patient's wife Patient understands diagnosis: Yes. Discussing patient identified problems/goals with staff: Yes. Medical problems stabilized or resolved: Yes. Denies suicidal/homicidal ideation: Yes. Issues/concerns per patient self-inventory: No. Other:   New problem(s) identified:   New Short Term/Long Term Goal(s): Detox, medication stabilization, elimination of SI thoughts, development of comprehensive mental wellness plan.    Patient Goals: "I had enough time to process and reflect on the severity of what happened. Because I do not remember"    Discharge Plan or Barriers: Patient plans to return home with his parents. Patient will follow up with an outpatient therapist and medication management provider for continuity of care.   Reason for Continuation of Hospitalization: None   Estimated Length of Stay: Discharge, 08/21/2019  Attendees: Patient: Robert Salazar  08/21/2019 11:10 AM  Physician: Dr. Myles Lipps, MD 08/21/2019 11:10 AM  Nursing:  08/21/2019 11:10 AM  RN Care Manager: 08/21/2019 11:10 AM  Social Worker: Radonna Ricker, LCSW 08/21/2019 11:10 AM  Recreational Therapist:  08/21/2019 11:10 AM  Other:  08/21/2019 11:10 AM  Other:  08/21/2019 11:10 AM  Other: 08/21/2019 11:10 AM    Scribe for Treatment Team: Marylee Floras, [place] 08/21/2019 11:10 AM

## 2019-08-23 ENCOUNTER — Inpatient Hospital Stay: Payer: 59 | Admitting: Adult Health

## 2019-08-29 ENCOUNTER — Ambulatory Visit (INDEPENDENT_AMBULATORY_CARE_PROVIDER_SITE_OTHER): Payer: 59 | Admitting: Adult Health

## 2019-08-29 ENCOUNTER — Encounter: Payer: Self-pay | Admitting: Adult Health

## 2019-08-29 ENCOUNTER — Other Ambulatory Visit: Payer: Self-pay

## 2019-08-29 VITALS — BP 84/50 | HR 111 | Temp 98.6°F | Ht 72.75 in | Wt 155.6 lb

## 2019-08-29 DIAGNOSIS — F411 Generalized anxiety disorder: Secondary | ICD-10-CM | POA: Diagnosis not present

## 2019-08-29 DIAGNOSIS — F319 Bipolar disorder, unspecified: Secondary | ICD-10-CM | POA: Diagnosis not present

## 2019-08-29 DIAGNOSIS — F339 Major depressive disorder, recurrent, unspecified: Secondary | ICD-10-CM

## 2019-08-29 DIAGNOSIS — Z09 Encounter for follow-up examination after completed treatment for conditions other than malignant neoplasm: Secondary | ICD-10-CM | POA: Diagnosis not present

## 2019-08-29 NOTE — Assessment & Plan Note (Signed)
Continue all medications as directed. Urgent referrals placed to Psychiatry and Psychology. Increase water intake- strive to drink at least half of your body weight in ounces. Follow heart healthy diet. Remain off all alcohol. Try to avoid all tobacco/vape products. In the event of worsening symptoms, patient is instructed to call the crisis hotline, 911, or go to the nearest emergency department for evaluation and treatment. Continue to social distance and wear a mask. Follow-up with primary care in 3 months, sooner if needed.
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
## 2019-08-29 NOTE — Patient Instructions (Addendum)
Bipolar 1 Disorder Bipolar 1 disorder is a mental health disorder in which a person has episodes of emotional highs, or mania, and may also have episodes of lows, or depression. Bipolar 1 disorder is different from other bipolar disorders in that it involves extreme episodes of mania (manic episodes). These episodes last at least one week or involve symptoms that are so severe that hospitalization is needed to keep the person safe. What are the causes? The cause of this condition is not known. What increases the risk? The following factors may make you more likely to develop this condition:  Having a family member with the disorder.  Having an imbalance of certain chemicals in the brain (neurotransmitters).  Experiencing stress, such as illness, financial problems, or a death.  Having certain conditions that affect the brain or spinal cord (neurologic conditions).  Having had a brain injury (trauma). What are the signs or symptoms? Symptoms of mania include:  Very high self-esteem or self-confidence.  Decreased need for sleep.  Unusual talkativeness. Speech may be very fast.  Racing thoughts with quick shifts between topics that may or may not be related (flight of ideas).  Ability to concentrate either greatly improved or decreased.  Increased purposeful activity, such as work, studies, or social activity.  Increased agitation. This could be pacing, squirming, fidgeting, or finger and toe tapping.  Impulsive behavior and poor judgment. This may result in high-risk activities that are sexual, financial, or physical. Symptoms of depression include:  Extreme degrees of sadness, uncontrollable crying, hopelessness, worthlessness, or numbness.  Sleep problems, such as insomnia, waking early, or sleeping too much.  No longer enjoying things you used to enjoy.  Isolation. You may often spend time alone.  Lack of energy or motivation, and moving more slowly than normal.   Trouble making decisions.  Increased appetite or loss of appetite.  Thoughts of death, or wanting to harm yourself. Sometimes, you may have a mixed mood. This means having symptoms of mania and depression at the same time. Stress can often trigger these symptoms. How is this diagnosed? This condition may be diagnosed based on:  Emotional episodes.  Medical history.  Use of alcohol, drugs, and prescription medicines. Certain medical conditions and substances can cause symptoms that seem like bipolar disorder. This is called secondary bipolar disorder. Your health care provider may ask you to take a short test. This helps to understand your symptoms. You may also be asked to see a mental health specialist to follow up on this diagnosis and start treatment. How is this treated?     This condition is a long-term (chronic) illness. It is often managed with ongoing treatment rather than treatment only when symptoms occur. A combination of treatments is the main approach. Treatment may include:  Medicine. Medicine can be prescribed by a health care provider who specializes in treating mental health disorders (psychiatrist). Medicines called mood stabilizers are usually prescribed. If symptoms occur during treatment with a mood stabilizer, other medicines may be added.  Psychotherapy. Some forms of talk therapy, such as cognitive behavioral therapy (CBT) and family therapy, can help with learning to manage bipolar disorder.  Psychoeducation. This helps you and others understand how this disorder is managed. Include friends and family in educational sessions so they learn how best to support you.  Methods of managing your condition, such as journaling or relaxation exercises. Relaxation exercises include: ? Yoga. ? Meditation. ? Deep breathing.  Lifestyle changes, such as: ? Limiting alcohol and drug use. ?  Exercising regularly. ? Structuring when you go to bed and get up. ? Eating a  healthy diet.  Electroconvulsive therapy (ECT). This is a procedure in which electricity is applied to the brain through the scalp. It may be used in cases of severe bipolar disorder when medicine and psychotherapy work too slowly or do not work. Follow these instructions at home: Activity  Return to your normal activities as told by your health care provider.  Find activities that you enjoy, and make time to do them.  Exercise regularly as told by your health care provider. Lifestyle   Follow a set schedule for eating and sleeping.  Eat a healthy diet that includes fresh fruits and vegetables, whole grains, low-fat dairy, and lean meat.  Get at least 7-8 hours of sleep each night.  Avoid using products that contain nicotine or tobacco. If you want help quitting, ask your health care provider.  Do not use drugs. Alcohol use  Do not drink alcohol if: ? Your health care provider tells you not to drink. ? You are pregnant, may be pregnant, or are planning to become pregnant.  If you drink alcohol: ? Limit how much you use to:  0-1 drink a day for women.  0-2 drinks a day for men. ? Be aware of how much alcohol is in your drink. In the [place] one drink equals one 12 oz bottle of beer (355 mL), one 5 oz glass of wine (148 mL), or one 1 oz glass of hard liquor (44 mL). General instructions  Take over-the-counter and prescription medicines only as told by your health care provider. You may think about stopping your medicine, but it is very important to take all your medicine as prescribed.  Consider joining a support group. Your health care provider may be able to recommend one.  Talk with your family and friends about your treatment goals and how they can help.  Keep all follow-up visits as told by your health care provider. This is important. Where to find more information  [employer] on Mental Illness: www.nami.[employer] of Mental Health:  http://www.maynard.net/ Contact a health care provider if:  Your symptoms get worse, or your loved ones tell you that your symptoms are getting worse.  You have uncomfortable side effects from your medicine.  You have trouble sleeping.  You have trouble doing daily activities.  You feel unsafe in your surroundings.  You are self-medicating with alcohol or drugs. Get help right away if:  You have new symptoms.  You have thoughts about harming yourself or others.  You are considering suicide. If you ever feel like you may hurt yourself or others, or have thoughts about taking your own life, get help right away. You can go to your nearest emergency department or call:  Your local emergency services (911 in the [place]  A suicide crisis helpline, such as the National Suicide Prevention Lifeline at (505) 381-1451. This is open 24 hours a day. Summary  Bipolar 1 disorder is a lifelong mental health disorder in which a person has episodes of mania and depression.  This disorder is mainly treated with a combination of medicines, talk and behavioral therapies, and, often, electroconvulsive therapy (ECT).  Include friends and family in educational sessions so they know how best to support you.  Get help right away if you are considering suicide. This information is not intended to replace advice given to you by your health care provider. Make sure you discuss any questions you  have with your health care provider. Document Revised: 12/20/2018 Document Reviewed: 12/20/2018 Elsevier Patient Education  2020 [employer].   Continue all medications as directed. Urgent referrals placed to Psychiatry and Psychology. Increase water intake- strive to drink at least half of your body weight in ounces. Follow heart healthy diet. Remain off all alcohol. Try to avoid all tobacco/vape products. In the event of worsening symptoms, patient is instructed to call the crisis hotline, 911, or go to the  nearest emergency department for evaluation and treatment. Continue to social distance and wear a mask. Follow-up with primary care in 3 months, sooner if needed. GREAT TO SEE YOU!

## 2019-09-05 ENCOUNTER — Encounter: Payer: Self-pay | Admitting: Neurology
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
## 2019-09-06 ENCOUNTER — Other Ambulatory Visit: Payer: Self-pay

## 2019-09-06 ENCOUNTER — Encounter: Payer: Self-pay | Admitting: Neurology

## 2019-09-06 ENCOUNTER — Telehealth (INDEPENDENT_AMBULATORY_CARE_PROVIDER_SITE_OTHER): Payer: 59 | Admitting: Neurology

## 2019-09-13 ENCOUNTER — Other Ambulatory Visit: Payer: Self-pay

## 2019-09-13 ENCOUNTER — Ambulatory Visit (HOSPITAL_COMMUNITY): Payer: 59 | Admitting: Psychiatry

## 2019-09-25 ENCOUNTER — Encounter: Payer: Self-pay | Admitting: Adult Health

## 2019-09-25 NOTE — Telephone Encounter (Signed)
Spoke with patient and he is concerned that he may have something stuck in his chest and this is why he is congested. I advised patient that there was not a provider in office this morning and that the provider has no available appointments for today and that I suggest he go to urgent care for evaluation. Patient verbalized understanding and stated he would reach out to urgent care. AS, CMA

## 2019-10-01 ENCOUNTER — Encounter: Payer: Self-pay | Admitting: Adult Health

## 2019-10-04 ENCOUNTER — Ambulatory Visit (HOSPITAL_COMMUNITY): Payer: 59 | Admitting: Psychiatry

## 2019-10-10 ENCOUNTER — Other Ambulatory Visit: Payer: Self-pay

## 2019-10-10 ENCOUNTER — Telehealth (HOSPITAL_COMMUNITY): Payer: Self-pay | Admitting: Licensed Clinical Social Worker

## 2019-10-10 ENCOUNTER — Ambulatory Visit (HOSPITAL_COMMUNITY): Payer: 59 | Admitting: Licensed Clinical Social Worker

## 2019-10-10 NOTE — Telephone Encounter (Signed)
Attempted contact w/ pt for today's scheduled [facility] at 2pm. Left voicemail asking for return of contact and phone number for this clinic.

## 2019-11-10 DIAGNOSIS — S065X9A Traumatic subdural hemorrhage with loss of consciousness of unspecified duration, initial encounter: Secondary | ICD-10-CM | POA: Insufficient documentation

## 2019-11-13 DIAGNOSIS — Z720 Tobacco use: Secondary | ICD-10-CM | POA: Insufficient documentation

## 2019-11-13 DIAGNOSIS — F1910 Other psychoactive substance abuse, uncomplicated: Secondary | ICD-10-CM | POA: Insufficient documentation

## 2019-11-13 DIAGNOSIS — T1491XA Suicide attempt, initial encounter: Secondary | ICD-10-CM | POA: Insufficient documentation

## 2019-11-13 DIAGNOSIS — T424X2A Poisoning by benzodiazepines, intentional self-harm, initial encounter: Secondary | ICD-10-CM | POA: Insufficient documentation

## 2019-11-13 DIAGNOSIS — F909 Attention-deficit hyperactivity disorder, unspecified type: Secondary | ICD-10-CM | POA: Insufficient documentation

## 2019-11-14 DIAGNOSIS — F314 Bipolar disorder, current episode depressed, severe, without psychotic features: Secondary | ICD-10-CM | POA: Insufficient documentation

## 2019-11-14 DIAGNOSIS — F6089 Other specific personality disorders: Secondary | ICD-10-CM | POA: Insufficient documentation

## 2019-11-16 MED ORDER — HYDROXYZINE PAMOATE 50 MG PO CAPS
50.00 | ORAL_CAPSULE | ORAL | Status: DC
Start: ? — End: 2019-11-16

## 2019-11-16 MED ORDER — LOPERAMIDE HCL 2 MG PO CAPS
2.00 | ORAL_CAPSULE | ORAL | Status: DC
Start: ? — End: 2019-11-16

## 2019-11-16 MED ORDER — POLYETHYLENE GLYCOL 3350 17 GM/SCOOP PO POWD
17.00 | ORAL | Status: DC
Start: ? — End: 2019-11-16

## 2019-11-16 MED ORDER — LORATADINE 10 MG PO TABS
10.00 | ORAL_TABLET | ORAL | Status: DC
Start: ? — End: 2019-11-16

## 2019-11-16 MED ORDER — VILAZODONE HCL 20 MG PO TABS
40.00 | ORAL_TABLET | ORAL | Status: DC
Start: 2019-11-17 — End: 2019-11-16

## 2019-11-16 MED ORDER — ALUM & MAG HYDROXIDE-SIMETH 200-200-20 MG/5ML PO SUSP
30.00 | ORAL | Status: DC
Start: ? — End: 2019-11-16

## 2019-11-16 MED ORDER — GENERIC EXTERNAL MEDICATION
Status: DC
Start: ? — End: 2019-11-16

## 2019-11-16 MED ORDER — IBUPROFEN 600 MG PO TABS
600.00 | ORAL_TABLET | ORAL | Status: DC
Start: ? — End: 2019-11-16

## 2019-11-16 MED ORDER — ACETAMINOPHEN 325 MG PO TABS
650.00 | ORAL_TABLET | ORAL | Status: DC
Start: ? — End: 2019-11-16

## 2019-11-16 MED ORDER — ZIPRASIDONE HCL 40 MG PO CAPS
40.00 | ORAL_CAPSULE | ORAL | Status: DC
Start: 2019-11-16 — End: 2019-11-16

## 2019-11-16 MED ORDER — ONDANSETRON HCL 4 MG/2ML IJ SOLN
4.00 | INTRAMUSCULAR | Status: DC
Start: ? — End: 2019-11-16
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
## 2019-11-20 ENCOUNTER — Ambulatory Visit: Payer: 59 | Admitting: Neurology

## 2019-11-20 ENCOUNTER — Encounter: Payer: Self-pay | Admitting: Neurology

## 2019-11-20 ENCOUNTER — Other Ambulatory Visit: Payer: Self-pay

## 2019-11-20 VITALS — BP 117/84 | HR 100 | Ht 73.0 in | Wt 158.4 lb

## 2019-11-20 DIAGNOSIS — R569 Unspecified convulsions: Secondary | ICD-10-CM | POA: Diagnosis not present

## 2019-11-20 DIAGNOSIS — S060X9A Concussion with loss of consciousness of unspecified duration, initial encounter: Secondary | ICD-10-CM

## 2019-11-20 DIAGNOSIS — G43109 Migraine with aura, not intractable, without status migrainosus: Secondary | ICD-10-CM

## 2019-11-20 DIAGNOSIS — S065X9A Traumatic subdural hemorrhage with loss of consciousness of unspecified duration, initial encounter: Secondary | ICD-10-CM | POA: Diagnosis not present

## 2019-11-20 NOTE — Patient Instructions (Signed)
1.  As per [facility], no driving for 6 months from last seizure. 2.  Limit use of pain relievers to no more than 2 days out of week to prevent risk of rebound or medication-overuse headache.  Try to taper off of ibuprofen by one day every week until taking no more than 2 days out of week 3.  Check sleep-deprived EEG 4.  No work 5.  Refer to concussion clinic 6.  Follow up in 30 days.

## 2019-11-21 ENCOUNTER — Telehealth: Payer: Self-pay

## 2019-11-21 NOTE — Telephone Encounter (Signed)
Received a referral for a concussion on patient. Please call to schedule. Thank you

## 2019-11-21 NOTE — Telephone Encounter (Signed)
Called and LM for pt.  Put a couple holds on Corey's schedule for Thursday and Friday so can use one of those or any 30 min slot that is between 8-3:30 pm.

## 2019-11-22 NOTE — Telephone Encounter (Signed)
Called pt and scheduled him for Friday, 11/24/19, at 1 pm.  He is having symptoms of memory loss, intelligence loss, HA, hallucinations and difficulty w/ his vision.

## 2019-11-24 ENCOUNTER — Ambulatory Visit: Payer: 59 | Admitting: Family Medicine

## 2019-11-24 NOTE — Progress Notes (Deleted)
Subjective:    Chief Complaint: Robert Salazar, LAT, ATC, am serving as scribe for Dr. Lynne Leader.  Robert Salazar,  is a 34 y.o. male who presents for evaluation of a head injury that occurred on 11/10/19 during an apparent suicide attempt when he mixed Xanax and alcohol and was found at a construction site in an unfinished home.  He suffered a subdural hematoma.  He was treated at [facility] ED in [place] and then transferred to [facility].  He has since seen his neurologist Dr. Tomi Salazar who referred him to the concussion clinic.  Since his initial injury, pt reports   Injury date : 11/10/19 Visit #: 1   History of Present Illness:    Concussion Self-Reported Symptom Score Symptoms rated on a scale 1-6, in last 24 hours   Headache: ***    Nausea: ***  Dizziness: ***  Vomiting: ***  Balance Difficulty: ***   Trouble Falling Asleep: ***   Fatigue: ***  Sleep Less Than Usual: ***  Daytime Drowsiness: ***  Sleep More Than Usual: ***  Photophobia: ***  Phonophobia: ***  Irritability: ***  Sadness: ***  Numbness or Tingling: ***  Nervousness: ***  Feeling More Emotional: ***  Feeling Mentally Foggy: ***  Feeling Slowed Down: ***  Memory Problems: ***  Difficulty Concentrating: ***  Visual Problems: ***   Total Symptom Score: *** Previous Symptom Score: ***   Neck Pain: Yes/No  Tinnitus: Yes/No  Review of Systems:  ***    Review of History: ***  Objective:    Physical Examination There were no vitals filed for this visit. MSK:  *** Neuro: *** Psych: ***   Concussion testing performed today:  I spent *** minutes with patient discussing test and results including review of history and patient chart and  integration of patient data, interpretation of standardized test results and clinical data, clinical decision making, treatment planning and report,and interactive feedback to the patient with all of patients questions answered.    Neurocognitive  testing (ImPACT):  Post #1: *** Post #2: *** Post #3: ***  Verbal Memory Composite *** (***%) *** (***%) *** (***%)  Visual Memory Composite *** (***%) *** (***%) *** (***%)  Visual Motor Speed Composite *** (***%) *** (***%) *** (***%)  Reaction Time Composite *** (***%) *** (***%) *** (***%)  Cognitive Efficiency Index *** ***  ***   Vestibular Screening:   Pre VOMS  HA Score: *** Pre VOMS  Dizziness Score: ***   Headache  Dizziness  Smooth Pursuits *** ***  H. Saccades *** ***  V. Saccades *** ***  H. VOR *** ***  V. VOR *** ***  Visual Motor Sensitivity *** ***      Convergence: *** cm  *** ***   Balance Screen: ***  Additional testing performed today:  Assessment and Plan   34 y.o. male with ***  Robert Salazar presents with the following concussion subtypes. [] Cognitive [] Cervical [] Vestibular [] Ocular [] Migraine [] Anxiety/Mood   ***    Action/Discussion: Reviewed diagnosis, management options, expected outcomes, and the reasons for scheduled and emergent follow-up. Questions were adequately answered. Patient expressed verbal understanding and agreement with the following plan.     Patient Education:  Reviewed with patient the risks (i.e, a repeat concussion, post-concussion syndrome, second-impact syndrome) of returning to play prior to complete resolution, and thoroughly reviewed the signs and symptoms of concussion.Reviewed need for complete resolution of all symptoms, with rest AND exertion, prior to return to play.  Reviewed red  flags for urgent medical evaluation: worsening symptoms, nausea/vomiting, intractable headache, musculoskeletal changes, focal neurological deficits.  [facility]'s Concussion Care Plan, which clearly outlines the plans stated above, was given to patient.   In addition to the time spent performing tests, I spent *** min   Reviewed with patient the risks (i.e, a repeat concussion, post-concussion syndrome,  second-impact syndrome) of returning to play prior to complete resolution, and thoroughly reviewed the signs and symptoms of      concussion. Reviewedf need for complete resolution of all symptoms, with rest AND exertion, prior to return to play.  Reviewed red flags for urgent medical evaluation: worsening symptoms, nausea/vomiting, intractable headache, musculoskeletal changes, focal neurological deficits.  [facility] Concussion Care Plan, which clearly outlines the plans stated above, was given to patient   After Visit Summary printed out and provided to patient as appropriate.  The above documentation has been reviewed and is accurate and complete Robert Salazar

## 2019-11-26 NOTE — Progress Notes (Signed)
Established Patient Office Visit  Subjective:  Patient ID: Robert Salazar, male    DOB: 1986/05/06  Age: 34 y.o. MRN: [id number]  CC:  Chief Complaint  Patient presents with  . Anxiety  . Depression    HPI Robert Salazar presents for 3 month follow-up on mood management. Pt was recently hospitalized for attempted suicide by overdosing on Xanax and traumatic brain injury resulting from a fall and hitting his head. He recently saw his neurologist and [facility] therapist, and has an upcoming appointment to the concussion clinic and with psychiatry. Reports headaches are gradually improving, they are less frequent and less intense. He has reduced his Ibuprofen use as recommended by his neurologist. During his hospitalization, Wellbutrin XL was discontinued and he was started on Geodon 40 mg 1 tablet twice daily, but reports only taking 1 tablet once daily due to side effects of repetitive movements, constant blinking, and "feels like a shell." Reports he feels on edge, is upset and having a difficult time coping with his wife's infidelity. He has trouble with insomnia and sleeps about 3-5 hours. 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 He has noticed his skin gets sensitive when exposed to the sun and questions if it could be due to the Geodon medication. Denies SI/HI- reports he has a son and he needs at least one good parent.   Past Medical History:  Diagnosis Date  . ADHD   . Bipolar 1 disorder ([place])   . Depression   . Headache     Past Surgical History:  Procedure Laterality Date  . VARICOCELECTOMY      Family History  Problem Relation Age of Onset  . Celiac disease Sister   . Cancer Maternal Grandmother        unknown to pt  . Cancer Paternal Grandfather        lung  . Diverticulitis Mother   . Other Mother        trigiminal neuralgia  . Non-Hodgkin's lymphoma Father      Social History   Socioeconomic History  . Marital status: Married    Spouse name: Ebony Hail  . Number of children: 1  . Years of education: Not on file  . Highest education level: Associate degree: occupational, Hotel manager, or vocational program  Occupational History  . Occupation: massage therapist    Employer: [employer]  Tobacco Use  . Smoking status: Former Smoker    Packs/day: 1.00    Years: 10.00    Pack years: 10.00    Types: Cigarettes    Quit date: 07/20/2014    Years since quitting: 5.3  . Smokeless tobacco: Never Used  Substance and Sexual Activity  . Alcohol use: Yes    Alcohol/week: 10.0 standard drinks    Types: 5 Glasses of wine, 5 Cans of beer per week    Comment: case of beer a week  . Drug use: Never  . Sexual activity: Yes    Birth control/protection: None  Other Topics Concern  . Not on file  Social History Narrative   Patient is right-handed. He lives with his wife and 38 yr old son in a split level home. He drinks one cup of coffee most days, and tea and soda rarely. 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   Social Determinants of Radio broadcast assistant  Strain:   . Difficulty of Paying Living Expenses:   Food Insecurity:   . Worried About Programme researcher, broadcasting/film/video in the Last Year:   . Barista in the Last Year:   Transportation Needs:   . Freight forwarder (Medical):   Marland Kitchen Lack of Transportation (Non-Medical):   Physical Activity:   . Days of Exercise per Week:   . Minutes of Exercise per Session:   Stress:   . Feeling of Stress :   Social Connections:   . Frequency of Communication with Friends and Family:   . Frequency of Social Gatherings with Friends and Family:   . Attends Religious Services:   . Active Member of Clubs or Organizations:   . Attends Banker Meetings:   Marland Kitchen Marital Status:   Intimate Partner Violence:   . Fear of Current or Ex-Partner:   . Emotionally Abused:   Marland Kitchen Physically Abused:    . Sexually Abused:     Outpatient Medications Prior to Visit  Medication Sig Dispense Refill  . Vilazodone HCl (VIIBRYD) 20 MG TABS Take 2 tablets (40 mg total) by mouth daily. 60 tablet 0  . ziprasidone (GEODON) 40 MG capsule Take 40 mg by mouth 2 times daily at 12 noon and 4 pm. Taking once daily at night    . lisdexamfetamine (VYVANSE) 50 MG capsule Take 50 mg by mouth daily.    Marland Kitchen buPROPion (WELLBUTRIN XL) 150 MG 24 hr tablet Take 1 tablet (150 mg total) by mouth daily. (Patient not taking: Reported on 11/20/2019) 30 tablet 0   No facility-administered medications prior to visit.    No Known Allergies  ROS  Review of Systems:  A fourteen system review of systems was performed and found to be positive as per HPI.  Objective:    Physical Exam  General: Well nourished, in no apparent distress. Eyes: PERRLA, EOMs, conjunctiva clr Resp: Respiratory effort- normal, ECTA B/L   Cardio: RRR w/o MRGs. Abdomen: no gross distention. Lymphatics:  less 2 sec cap RF M-sk: Good strength, stable gait.  Skin: Warm, dry  Neuro: Alert, Oriented Psych: Blunt affect, Insight and Judgment stable.   BP 109/77   Pulse 96   Temp 98 F (36.7 C)   Ht 6' (1.829 m)   Wt 155 lb 3.2 oz (70.4 kg)   SpO2 100%   BMI 21.05 kg/m  Wt Readings from Last 3 Encounters:  11/28/19 156 lb 9.6 oz (71 kg)  11/27/19 155 lb 3.2 oz (70.4 kg)  11/20/19 158 lb 6.4 oz (71.8 kg)     Health Maintenance Due  Topic Date Due  . COVID-19 Vaccine (1) Never done    There are no preventive care reminders to display for this patient.  Lab Results  Component Value Date   TSH 2.400 12/15/2018   Lab Results  Component Value Date   WBC 11.8 (H) 08/19/2019   HGB 16.0 08/19/2019   HCT 46.5 08/19/2019   MCV 98.7 08/19/2019   PLT 258 08/19/2019   Lab Results  Component Value Date   NA 140 08/19/2019   K 3.8 08/19/2019   CO2 24 08/19/2019   GLUCOSE 117 (H) 08/19/2019   BUN 11 08/19/2019   CREATININE 1.11  08/19/2019   BILITOT 0.6 08/19/2019   ALKPHOS 56 08/19/2019   AST 24 08/19/2019   ALT 17 08/19/2019   PROT 8.4 (H) 08/19/2019   ALBUMIN 4.8 08/19/2019   CALCIUM 9.2 08/19/2019   ANIONGAP 11  08/19/2019   Lab Results  Component Value Date   CHOL 202 (H) 07/12/2019   Lab Results  Component Value Date   HDL 48 07/12/2019   Lab Results  Component Value Date   LDLCALC 134 (H) 07/12/2019   Lab Results  Component Value Date   TRIG 109 07/12/2019   Lab Results  Component Value Date   CHOLHDL 4.2 07/12/2019   Lab Results  Component Value Date   HGBA1C 4.9 12/15/2018      Assessment & Plan:   Problem List Items Addressed This Visit      Nervous and Auditory   Traumatic subdural hematoma with loss of consciousness (HCC)     Other   Bipolar 1 disorder (HCC)   Depression   GAD (generalized anxiety disorder)   Hospital discharge follow-up - Primary   ADHD (attention deficit hyperactivity disorder)   Suicide attempt (HCC)    Other Visit Diagnoses    Intractable migraine with aura with status migrainosus       Concussion with loss of consciousness, initial encounter         Hospital Discharge, Mood- Depression, GAD, Bipolar Type 1 disorder, ADHD: - Follow up with several specialists as scheduled. - Continue current medication regimen and discuss with Psychiatry the possibility of adjusting Geodon dose due to extrapyramidal symptoms, and recommend using sunscreen for skin photosensitivity. - Denies SI/HI and advise to seek immediate medical care if he starts having any SI/HI or call suicide crisis helpline. - Continue Psychology Therapy sessions and recommend mindfulness interventions to help cope and reduce stress. - Follow-up in 3 months    Traumatic subdural hematoma, Intractable Migraine: - Followed by Neurology and follow-up as directed. - Agree with neurologist recommendations of no driving and to remain out of work until his follow-up in 30 days.  Concussion  with loss of consciousness: - Follow up as scheduled with Sports Medicine.  No orders of the defined types were placed in this encounter.   Follow-up: Return in about 3 months (around 02/27/2020) for Mood.    Mayer Masker, PA-C

## 2019-11-27 ENCOUNTER — Ambulatory Visit (INDEPENDENT_AMBULATORY_CARE_PROVIDER_SITE_OTHER): Payer: 59 | Admitting: Physician Assistant

## 2019-11-27 ENCOUNTER — Encounter: Payer: Self-pay | Admitting: Physician Assistant

## 2019-11-27 ENCOUNTER — Other Ambulatory Visit: Payer: Self-pay

## 2019-11-27 VITALS — BP 109/77 | HR 96 | Temp 98.0°F | Ht 72.0 in | Wt 155.2 lb

## 2019-11-27 DIAGNOSIS — F411 Generalized anxiety disorder: Secondary | ICD-10-CM

## 2019-11-27 DIAGNOSIS — T1491XA Suicide attempt, initial encounter: Secondary | ICD-10-CM

## 2019-11-27 DIAGNOSIS — F319 Bipolar disorder, unspecified: Secondary | ICD-10-CM

## 2019-11-27 DIAGNOSIS — F339 Major depressive disorder, recurrent, unspecified: Secondary | ICD-10-CM

## 2019-11-27 DIAGNOSIS — S060X9A Concussion with loss of consciousness of unspecified duration, initial encounter: Secondary | ICD-10-CM

## 2019-11-27 DIAGNOSIS — G43111 Migraine with aura, intractable, with status migrainosus: Secondary | ICD-10-CM

## 2019-11-27 DIAGNOSIS — F900 Attention-deficit hyperactivity disorder, predominantly inattentive type: Secondary | ICD-10-CM

## 2019-11-27 DIAGNOSIS — Z09 Encounter for follow-up examination after completed treatment for conditions other than malignant neoplasm: Secondary | ICD-10-CM

## 2019-11-27 DIAGNOSIS — S065X9A Traumatic subdural hemorrhage with loss of consciousness of unspecified duration, initial encounter: Secondary | ICD-10-CM

## 2019-11-27 NOTE — Patient Instructions (Signed)
What causes depression?  You may already know that depression (MDD) is a complex medical condition. The exact cause is unknown, but it's most likely a combination of several factors: genetic, biological, environmental, and psychological.  Depression can run in families, but not everyone with depression has a family history. Scientists are trying to identify genes involved to understand the role that genetics may play in depression.  Environmental factors that may play a role in depression include trauma, loss of a loved one, a difficult relationship, or other stressful situations. Some depressive episodes may occur without an obvious trigger.  If you think you may have depression, there is something you can do about it. The first step is to talk to your healthcare professional about your symptoms.  You can also read about the symptoms of depression below to help you get a better understanding about the types of changes you may be experiencing.    Depression and the brain:  It's thought that certain neurotransmitters such as serotonin, norepinephrine, and dopamine (chemicals that the brain uses to communicate) are out of balance when you're depressed  Serotonin is one of the chemicals believed to be affected by depression - it is thought to be involved in the regulation of mood and other functions    What are the symptoms of depression?  Emotional. Physical. Cognitive.  1. Little interest or pleasure in doing things 2. Feeling down, depressed, or hopeless 3. Trouble falling or staying asleep, or sleeping too much 4. Feeling tired or having little energy 5. Poor appetite, overeating, or considerable weight changes 6. Feeling bad about yourself - that you are a failure or having a lot of guilt 7. Difficulty concentrating on things or making decisions 8. Moving or speaking slowly, so that other people have noticed, or being so restless that you've been moving around a lot 9. Thoughts  that you would be better off dead, or of hurting yourself in some way   Depression is a complex medical condition that may make it hard to feel like yourself. Talk to your healthcare professional about all of your symptoms and their impact.   You may be suffering from depression if you are experiencing five or more of the symptoms above including either depressed mood or decreased interest or pleasure. In addition, depression is also associated with:   Symptoms that are new or noticeably worse compared to what they were prior to the episode  Symptoms that persist for most of the day, nearly every day for at least two consecutive weeks  Episodes that are accompanied by clinically significant distress or impaired functioning    Myth:  Depression is something you can just "snap out of".   Fact:  No one chooses to be depressed, and it is not caused by laziness, weakness, or simply feeling sad. Depression is a complex medical condition. The exact cause is unknown, but it's most likely a combination of several factors: genetic, biological, environmental, and psychological. You cannot control your loved one's recovery, but you can support them along the way.   Myth:  Depression only affects your emotions  Fact:  Depression can be emotional, physical, and cognitive symptoms. It can result in symptoms that include little interest or pleasure in doing things; feeling down, depressed, or hopeless; trouble falling or staying asleep, or sleeping too much; feeling tired or having little energy; poor appetite, overeating, or considerable weight changes; feeling bad about yourself, like you are a failure or having a lot of guilt; trouble  concentrating on things or making decisions; moving or speaking slowly, so that other people have noticed, or being so restless that you've been moving around a lot; and thoughts that you would be better off dead, or of hurting yourself in some way. People with  depression do not all experience the same symptoms, and the severity, frequency, and duration can vary     Do Cooks Hook-up and square breathing 4-5 times per day.  10 square breathing each time.    -Also we need to transition your brain into thinking more positively.  These tasks below are some things I want you to do every day 1)  write 3 new things that you are grateful for every day for 21 days  2)  exercise daily- walk for 15 minutes twice a day every day 3)  you are going to journal every day about one positive experience that you had 4)  meditate every day.  You can go on YouTube and look for 15-minute relaxation meditation or what ever.  But we need to make sure that you are in the moment and relaxing and deep breathing every day 5)  Write 1 positive email every day to praise someone in your life     - If you have insomnia or difficulty sleeping, this information is for you:  - Avoid caffeinated beverages after lunch,  no alcoholic beverages,  no eating within 2-3 hours of lying down,  avoid exposure to blue light before bed,  avoid daytime naps, and  needs to maintain a regular sleep schedule- go to sleep and wake up around the same time every night.    - Recommend patient meditate or do deep breathing exercises to help relax.   Incorporate the use of white noise machines or listen to "sleep meditation music", or recordings of guided meditations for sleep from YouTube which are free, such as  "guided meditation for detachment from over thinking"  by Ina Kick.

## 2019-11-28 ENCOUNTER — Ambulatory Visit: Payer: 59 | Admitting: Family Medicine

## 2019-11-28 ENCOUNTER — Encounter: Payer: Self-pay | Admitting: Family Medicine

## 2019-11-28 VITALS — BP 124/78 | HR 83 | Ht 72.0 in | Wt 156.6 lb

## 2019-11-28 DIAGNOSIS — F319 Bipolar disorder, unspecified: Secondary | ICD-10-CM

## 2019-11-28 DIAGNOSIS — S065X9A Traumatic subdural hemorrhage with loss of consciousness of unspecified duration, initial encounter: Secondary | ICD-10-CM

## 2019-11-28 DIAGNOSIS — S060X9A Concussion with loss of consciousness of unspecified duration, initial encounter: Secondary | ICD-10-CM

## 2019-11-28 MED ORDER — TRAZODONE HCL 50 MG PO TABS: 50.0000 mg | ORAL_TABLET | Freq: Every day | ORAL | 1 refills | Status: AC

## 2019-11-28 NOTE — Patient Instructions (Signed)
Thank you for coming in today. Start trazodone at bedtime.  Ok to advance activity as tolerated.  Be careful. Have a spotter like we talked about.  If you feel well enough to try return to work with reduced hours let me know. I will talk about it with Dr Everlena Cooper.  Keep me updated.  Please send the GeneSight test to me and potentially bring a copy to your first appointment at [facility].   Recheck in 3-4 weeks.   Will refer to vestibular PT soon if not better.  Ok to refer prior to next visit if needed. Let me know.
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
## 2019-12-13 ENCOUNTER — Ambulatory Visit (INDEPENDENT_AMBULATORY_CARE_PROVIDER_SITE_OTHER): Payer: 59 | Admitting: Neurology

## 2019-12-13 ENCOUNTER — Other Ambulatory Visit: Payer: Self-pay

## 2019-12-13 DIAGNOSIS — S065X9A Traumatic subdural hemorrhage with loss of consciousness of unspecified duration, initial encounter: Secondary | ICD-10-CM

## 2019-12-13 DIAGNOSIS — S060X9A Concussion with loss of consciousness of unspecified duration, initial encounter: Secondary | ICD-10-CM

## 2019-12-13 DIAGNOSIS — R569 Unspecified convulsions: Secondary | ICD-10-CM

## 2019-12-13 DIAGNOSIS — G43109 Migraine with aura, not intractable, without status migrainosus: Secondary | ICD-10-CM

## 2019-12-14 NOTE — Progress Notes (Signed)
EEG is normal

## 2019-12-14 NOTE — Procedures (Signed)
ONE-HOUR SLEEP-DEPRIVED ELECTROENCEPHALOGRAM REPORT  Date of Study: 12/13/2019  Patient's Name: Robert Salazar MRN: [id number] Date of Birth: [birth year]   Clinical History: 34 year old male with Bipolar disorder and status post traumatic intracranial hemorrhage after found down.  Prior history of seizure-like behavior in setting of alcohol intoxication and psychosis  Medications: WELLBUTRIN XL 150 MG 24 hr tablet VYVANSE 50 MG capsule VIIBRYD 20 MG TABS  Technical Summary: A multichannel digital EEG recording measured by the international 10-20 system with electrodes applied with paste and impedances below 5000 ohms performed in our laboratory with EKG monitoring in an awake and drowsy patient.  Hyperventilation not performed as patient wearing mask due to COVID-19 pandemic.  Photic stimulation was performed.  The digital EEG was referentially recorded, reformatted, and digitally filtered in a variety of bipolar and referential montages for optimal display.    Description: The patient is awake and drowsy during the recording.  During maximal wakefulness, there is a symmetric, medium voltage 11 Hz posterior dominant rhythm that attenuates with eye opening.  The record is symmetric.  During drowsiness, there is an increase in theta slowing of the background.  Stage 2 sleep is not seen.  Photic stimulation did not elicit any abnormalities.  There were no epileptiform discharges or electrographic seizures seen.    EKG lead was unremarkable.  Impression: This awake and drowsy EEG is normal.    Clinical Correlation: A normal EEG does not exclude a clinical diagnosis of epilepsy.  Clinical correlation is advised.   Shon Millet, DO
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
## 2019-12-19 ENCOUNTER — Ambulatory Visit (INDEPENDENT_AMBULATORY_CARE_PROVIDER_SITE_OTHER): Payer: 59 | Admitting: Family Medicine

## 2019-12-19 ENCOUNTER — Encounter: Payer: Self-pay | Admitting: Family Medicine

## 2019-12-19 ENCOUNTER — Encounter: Payer: Self-pay | Admitting: Neurology

## 2019-12-19 ENCOUNTER — Other Ambulatory Visit: Payer: Self-pay

## 2019-12-19 ENCOUNTER — Ambulatory Visit: Payer: 59 | Admitting: Neurology

## 2019-12-19 VITALS — BP 110/78 | HR 89 | Ht 72.0 in | Wt 161.8 lb

## 2019-12-19 VITALS — BP 118/76 | Ht 72.0 in | Wt 161.0 lb

## 2019-12-19 DIAGNOSIS — S065X9A Traumatic subdural hemorrhage with loss of consciousness of unspecified duration, initial encounter: Secondary | ICD-10-CM

## 2019-12-19 DIAGNOSIS — G43109 Migraine with aura, not intractable, without status migrainosus: Secondary | ICD-10-CM

## 2019-12-19 DIAGNOSIS — H8110 Benign paroxysmal vertigo, unspecified ear: Secondary | ICD-10-CM

## 2019-12-19 DIAGNOSIS — R569 Unspecified convulsions: Secondary | ICD-10-CM

## 2019-12-19 DIAGNOSIS — S060X9A Concussion with loss of consciousness of unspecified duration, initial encounter: Secondary | ICD-10-CM

## 2019-12-19 NOTE — Progress Notes (Signed)
Subjective:    Chief Complaint: [name], LAT, ATC, am serving as scribe for Dr. Lynne Leader.  Robert Salazar,  is a 34 y.o. male who presents for f/u of concussion that he sustained on 11/10/19 after ingesting a large amount of Xanax and alcohol.  He was last seen by Dr. Georgina Snell on 11/28/19 w/ c/o headache, memory loss, dizziness, decreased sensation and difficulty sleeping.  Since his last visit, pt reports that he con't to have dizziness w/ cervical extension and w/ exertion.  He states that he hasn't had a HA in about one week.  He had a sleep study on 12/13/19 at [facility].  Injury date : 11/10/19 Visit #: 2   History of Present Illness:    Concussion Self-Reported Symptom Score Symptoms rated on a scale 1-6, in last 24 hours   Headache: 0    Nausea: 1  Dizziness: 3  Vomiting: 0  Balance Difficulty: 2   Trouble Falling Asleep: 4   Fatigue: 4  Sleep Less Than Usual: 0  Daytime Drowsiness: 5  Sleep More Than Usual: 0  Photophobia: 2  Phonophobia: 1  Irritability: 1  Sadness: 4  Numbness or Tingling: 1  Nervousness: 1  Feeling More Emotional: 4  Feeling Mentally Foggy: 1  Feeling Slowed Down: 0  Memory Problems: 1  Difficulty Concentrating: 0  Visual Problems: 0   Total # of Symptoms: 15/22 Total Symptom Score: 35/132 Previous Total # of Symptoms: 20/22 Previous Symptom Score: 95/132   Neck Pain: Yes  Tinnitus: Yes  Review of Systems: No fevers or chills    Review of History: Depression  Objective:    Physical Examination Vitals:   12/19/19 1406  BP: 110/78  Pulse: 89  SpO2: 96%   MSK: C-spine normal-appearing normal motion Neuro: Alert and oriented normal coordination.  Balance improved.  Slight abnormality with single-leg and tandem stance however significant improvement from previous.  Psych: Certain oriented normal speech thought process and affect.     Assessment and Plan   34 y.o. male with concussion secondary to subdural  hematoma.  Significant improvement.  Patient having some vertigo type symptoms thought to be BPPV.  Plan to refer to vestibular therapy.  Patient has improved enough that he is probably ready to consider starting to return to work.  He works as a Geophysicist/field seismologist.  He will start practicing and doing work like activities at home.  When he feels like he is ready he will let me know anticipate return to work either part-time or full-time in the near future.  We will be happy to write work note.  Recheck back with me in about a month if not improved otherwise return as needed.        Action/Discussion: Reviewed diagnosis, management options, expected outcomes, and the reasons for scheduled and emergent follow-up. Questions were adequately answered. Patient expressed verbal understanding and agreement with the following plan.     Patient Education:  Reviewed with patient the risks (i.e, a repeat concussion, post-concussion syndrome, second-impact syndrome) of returning to play prior to complete resolution, and thoroughly reviewed the signs and symptoms of concussion.Reviewed need for complete resolution of all symptoms, with rest AND exertion, prior to return to play.  Reviewed red flags for urgent medical evaluation: worsening symptoms, nausea/vomiting, intractable headache, musculoskeletal changes, focal neurological deficits.  [facility] Con[facility] Care Plan, which clearly outlines the plans stated above, was given to patient.   In addition to the time spent performing  tests, I spent 30 min   Reviewed with patient the risks (i.e, a repeat concussion, post-concussion syndrome, second-impact syndrome) of returning to play prior to complete resolution, and thoroughly reviewed the signs and symptoms of      concussion. Reviewedf need for complete resolution of all symptoms, with rest AND exertion, prior to return to play.  Reviewed red flags for urgent medical evaluation:  worsening symptoms, nausea/vomiting, intractable headache, musculoskeletal changes, focal neurological deficits.  [facility] Concussion Care Plan, which clearly outlines the plans stated above, was given to patient   After Visit Summary printed out and provided to patient as appropriate.  The above documentation has been reviewed and is accurate and complete Robert Salazar

## 2019-12-19 NOTE — Patient Instructions (Signed)
Follow up in 4 months 

## 2019-12-19 NOTE — Patient Instructions (Signed)
Thank you for coming in today. Plan for balance therapy.  Ok to return to work when able.  Do some practice and let me know if you want me to write a letter saying return to work half days or full days.  Let me know.    Benign Positional Vertigo Vertigo is the feeling that you or your surroundings are moving when they are not. Benign positional vertigo is the most common form of vertigo. This is usually a harmless condition (benign). This condition is positional. This means that symptoms are triggered by certain movements and positions. This condition can be dangerous if it occurs while you are doing something that could cause harm to you or others. This includes activities such as driving or operating machinery. What are the causes? In many cases, the cause of this condition is not known. It may be caused by a disturbance in an area of the inner ear that helps your brain to sense movement and balance. This disturbance can be caused by:  Viral infection (labyrinthitis).  Head injury.  Repetitive motion, such as jumping, dancing, or running. What increases the risk? You are more likely to develop this condition if:  You are a woman.  You are 64 years of age or older. What are the signs or symptoms? Symptoms of this condition usually happen when you move your head or your eyes in different directions. Symptoms may start suddenly, and usually last for less than a minute. They include:  Loss of balance and falling.  Feeling like you are spinning or moving.  Feeling like your surroundings are spinning or moving.  Nausea and vomiting.  Blurred vision.  Dizziness.  Involuntary eye movement (nystagmus). Symptoms can be mild and cause only minor problems, or they can be severe and interfere with daily life. Episodes of benign positional vertigo may return (recur) over time. Symptoms may improve over time. How is this diagnosed? This condition may be diagnosed based on:  Your  medical history.  Physical exam of the head, neck, and ears.  Tests, such as: ? MRI. ? CT scan. ? Eye movement tests. Your health care provider may ask you to change positions quickly while he or she watches you for symptoms of benign positional vertigo, such as nystagmus. Eye movement may be tested with a variety of exams that are designed to evaluate or stimulate vertigo. ? An electroencephalogram (EEG). This records electrical activity in your brain. ? Hearing tests. You may be referred to a health care provider who specializes in ear, nose, and throat (ENT) problems (otolaryngologist) or a provider who specializes in disorders of the nervous system (neurologist). How is this treated?  This condition may be treated in a session in which your health care provider moves your head in specific positions to adjust your inner ear back to normal. Treatment for this condition may take several sessions. Surgery may be needed in severe cases, but this is rare. In some cases, benign positional vertigo may resolve on its own in 2-4 weeks. Follow these instructions at home: Safety  Move slowly. Avoid sudden body or head movements or certain positions, as told by your health care provider.  Avoid driving until your health care provider says it is safe for you to do so.  Avoid operating heavy machinery until your health care provider says it is safe for you to do so.  Avoid doing any tasks that would be dangerous to you or others if vertigo occurs.  If you have trouble  walking or keeping your balance, try using a cane for stability. If you feel dizzy or unstable, sit down right away.  Return to your normal activities as told by your health care provider. Ask your health care provider what activities are safe for you. General instructions  Take over-the-counter and prescription medicines only as told by your health care provider.  Drink enough fluid to keep your urine pale yellow.  Keep all  follow-up visits as told by your health care provider. This is important. Contact a health care provider if:  You have a fever.  Your condition gets worse or you develop new symptoms.  Your family or friends notice any behavioral changes.  You have nausea or vomiting that gets worse.  You have numbness or a "pins and needles" sensation. Get help right away if you:  Have difficulty speaking or moving.  Are always dizzy.  Faint.  Develop severe headaches.  Have weakness in your legs or arms.  Have changes in your hearing or vision.  Develop a stiff neck.  Develop sensitivity to light. Summary  Vertigo is the feeling that you or your surroundings are moving when they are not. Benign positional vertigo is the most common form of vertigo.  The cause of this condition is not known. It may be caused by a disturbance in an area of the inner ear that helps your brain to sense movement and balance.  Symptoms include loss of balance and falling, feeling that you or your surroundings are moving, nausea and vomiting, and blurred vision.  This condition can be diagnosed based on symptoms, physical exam, and other tests, such as MRI, CT scan, eye movement tests, and hearing tests.  Follow safety instructions as told by your health care provider. You will also be told when to contact your health care provider in case of problems. This information is not intended to replace advice given to you by your health care provider. Make sure you discuss any questions you have with your health care provider. Document Revised: 12/15/2017 Document Reviewed: 12/15/2017 Elsevier Patient Education  2020 [employer].

## 2019-12-28 ENCOUNTER — Encounter: Payer: Self-pay | Admitting: Physician Assistant

## 2019-12-28 MED ORDER — VIIBRYD 20 MG PO TABS: 40.0000 mg | ORAL_TABLET | Freq: Every day | ORAL | 0 refills | Status: AC

## 2019-12-28 NOTE — Telephone Encounter (Signed)
Patient requesting medication refill. This is not a medicine that we have prescribed for patient. He is 'in between psych providers at the moment'  Please approve if refill deemed appropriate. AS< CMA

## 2019-12-28 NOTE — Telephone Encounter (Signed)
Spoke with Kandis Cocking who verbalized ok to send 90 day supply to pharmacy. AS, CMA

## 2020-01-01 ENCOUNTER — Telehealth: Payer: Self-pay

## 2020-01-01 NOTE — Telephone Encounter (Signed)
Prior authorization for Robert Salazar was denied by [employer].  Do you wish to change therapy?  Please advise.  Tiajuana Amass, CMA

## 2020-01-02 NOTE — Telephone Encounter (Signed)
Please call patient and notify PA denied by his insurance company. I suggest he call his insurance to see what alternatives are covered. Please inquire status of his Psychiatry transfer since they are the ones who manage his MDD.   Thank you, Kandis Cocking

## 2020-01-02 NOTE — Telephone Encounter (Signed)
Left message for patient to call back to advise below. AS< CMA

## 2020-01-03 NOTE — Telephone Encounter (Signed)
Patient is aware of the below and verbalized understanding.   Patient states he has it covered by [facility] and is able to get the medication.  He states he does not currently have Psychiatry but is "working on finding someone".   AS, CMA

## 2020-01-17 ENCOUNTER — Encounter: Payer: Self-pay | Admitting: Family Medicine

## 2020-01-19 ENCOUNTER — Encounter: Payer: Self-pay | Admitting: Family Medicine

## 2020-02-28 ENCOUNTER — Ambulatory Visit: Payer: 59 | Admitting: Physician Assistant

## 2020-03-09 ENCOUNTER — Encounter: Payer: Self-pay | Admitting: Physician Assistant

## 2020-04-05 ENCOUNTER — Other Ambulatory Visit: Payer: Self-pay | Admitting: Physician Assistant

## 2020-04-05 NOTE — Telephone Encounter (Signed)
Spoke with Kandis Cocking who advised to follow medication refill protocol. Patient needs apt for further refills. Please call patient to schedule. AS, CMA

## 2020-04-05 NOTE — Telephone Encounter (Signed)
Patient was last seen 11/27/19 and advised to follow up in 3 months.   Patient was also advised to get further refills of Viibryd from new psychologist.   Patient has not established care with new psychologist at this point.   Please review and advise if refill is appropriate. AS, CMA

## 2020-04-05 NOTE — Telephone Encounter (Signed)
Please call patient to schedule apt for further refills. AS, CMA 
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
## 2020-05-02 ENCOUNTER — Ambulatory Visit: Payer: 59 | Admitting: Neurology
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
## 2020-10-03 ENCOUNTER — Ambulatory Visit: Payer: 59 | Admitting: Neurology

## 2021-04-14 IMAGING — MR MR BRAIN/IAC WITHOUT AND WITH CONTRAST
11 of 12 series · 38 of 48 positions shown · IV contrast (multihance)
Comparison: No pertinent prior studies available for comparison.

CLINICAL DATA: Hearing loss of left ear, unspecified hearing loss
type. Additional history: Migraines, diminished hearing and pressure
in left ear for 1 month.

EXAM:
MRI HEAD WITHOUT AND WITH CONTRAST
TECHNIQUE: Multiplanar, multiecho pulse sequences of the brain and surrounding
structures were obtained without and with intravenous contrast. An
IAC protocol was utilized.
CONTRAST:  15mL MULTIHANCE GADOBENATE DIMEGLUMINE 529 MG/ML IV SOLN

[Series 2: T1 · sagittal · 5.0mm · 0.45mm/px · 3 of 24 slices shown (1 of 4)]
[im 1/24]
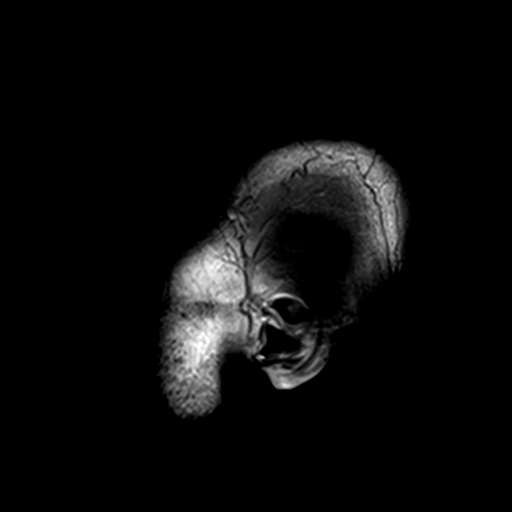
[im 12/24]
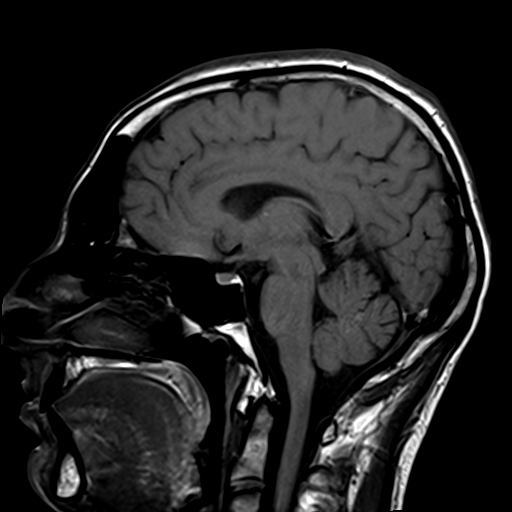
[im 24/24]
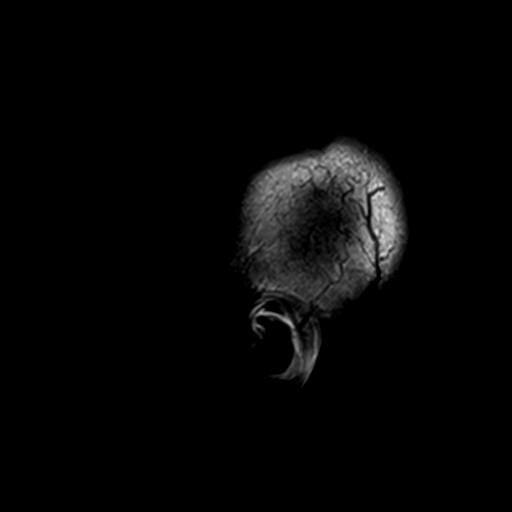

[Series 3: ep2d_diff_3 · axial · 3.0mm · 1.80mm/px · z∈[-48,+99]mm · 8 of 99 slices shown]
[im 1/99]
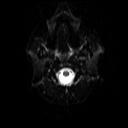
[im 11/99]
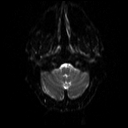
[im 33/99]
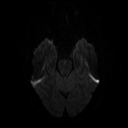
[im 44/99]
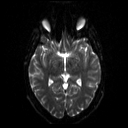
[im 55/99]
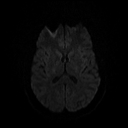
[im 66/99]
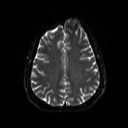
[im 88/99]
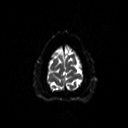
[im 99/99]
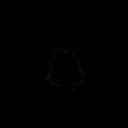

[Series 4: ep2d_diff_3_adc · axial · 3.0mm · 1.80mm/px · z∈[-48,+99]mm · 5 of 49 slices shown]
[im 1/49]
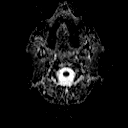
[im 13/49]
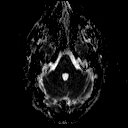
[im 25/49]
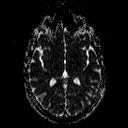
[im 37/49]
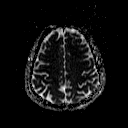
[im 49/49]
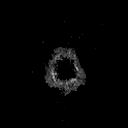

[Series 5: t2_tse_tra_512 · axial · 5.0mm · 0.60mm/px · z∈[-51,+105]mm · 3 of 26 slices shown]
[im 1/26]
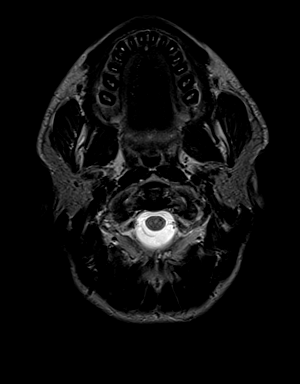
[im 13/26]
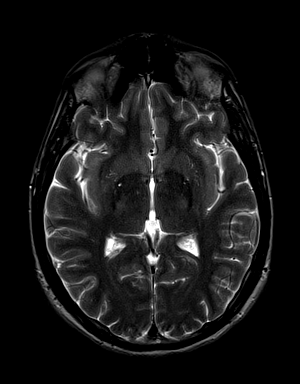
[im 26/26]
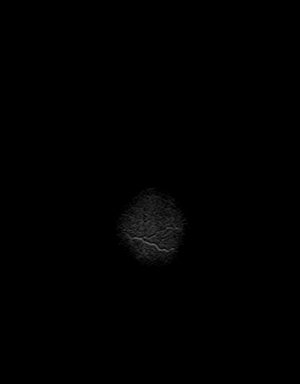

[Series 7: swi_images · axial · 2.0mm · 0.90mm/px · z∈[-52,+105]mm · 8 of 80 slices shown]
[im 1/80]
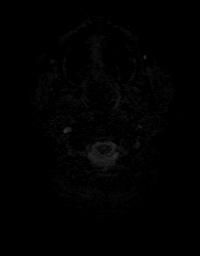
[im 12/80]
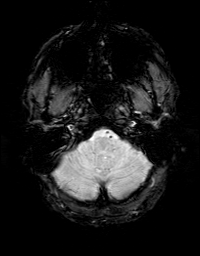
[im 23/80]
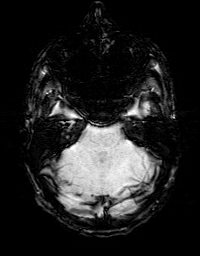
[im 34/80]
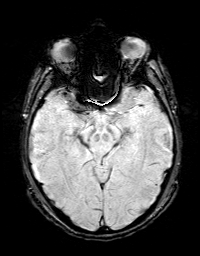
[im 46/80]
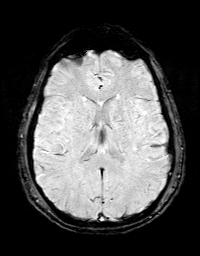
[im 57/80]
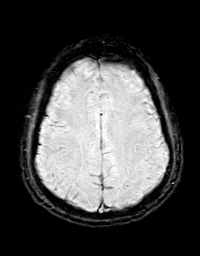
[im 68/80]
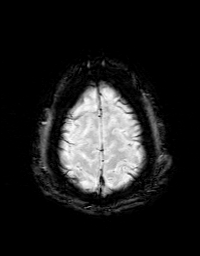
[im 80/80]
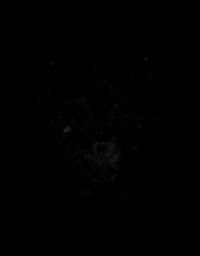

[Series 8: FLAIR · axial · 3.0mm · 0.43mm/px · z∈[-52,+103]mm · 3 of 27 slices shown]
[im 1/27]
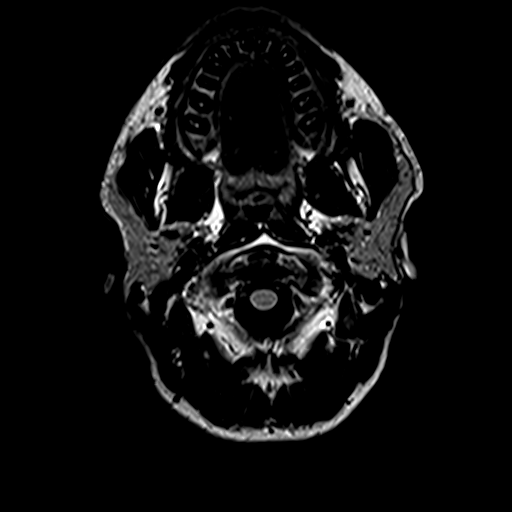
[im 14/27]
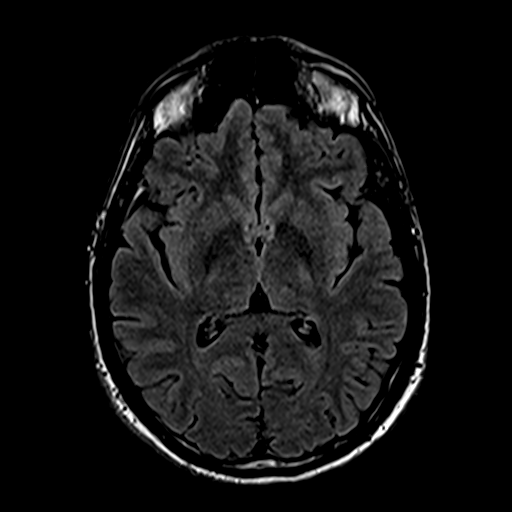
[im 27/27]
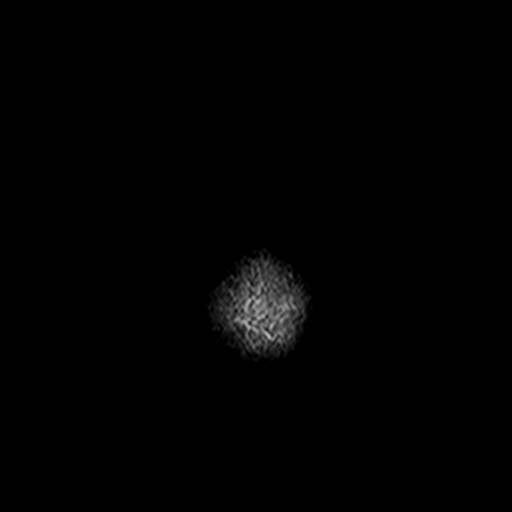

[Series 9: T1 · coronal · 3.0mm · 0.35mm/px · 1 of 11 slices shown (2 of 4)]
[im 1/11]
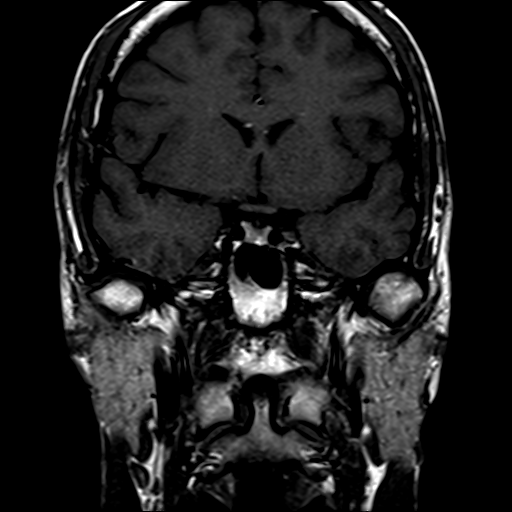

[Series 10: T1 · axial · 3.0mm · 0.35mm/px · 1 of 11 slices shown (3 of 4)]
[im 1/11]
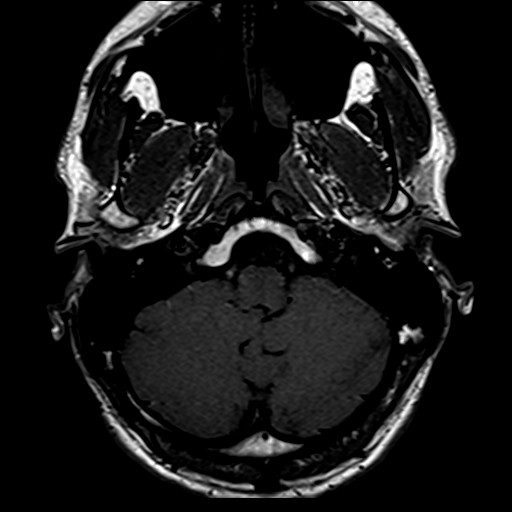

[Series 11: bSSFP · axial · 0.7mm · 0.28mm/px · z∈[-33,-3]mm · 4 of 44 slices shown]
[im 1/44]
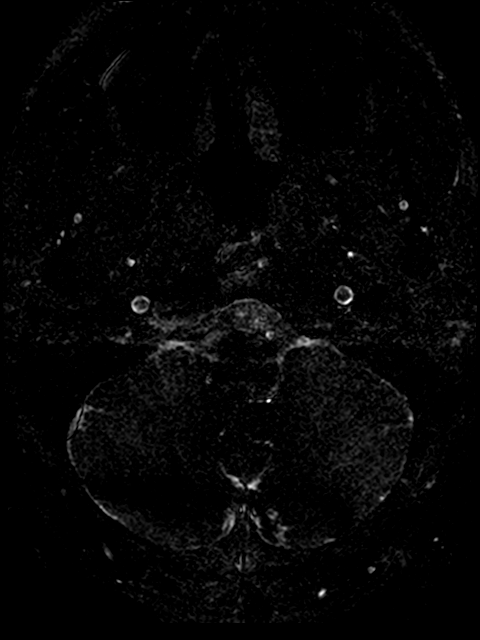
[im 15/44]
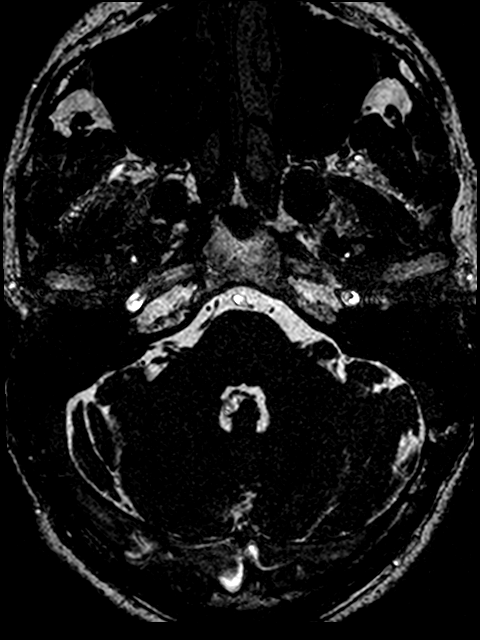
[im 29/44]
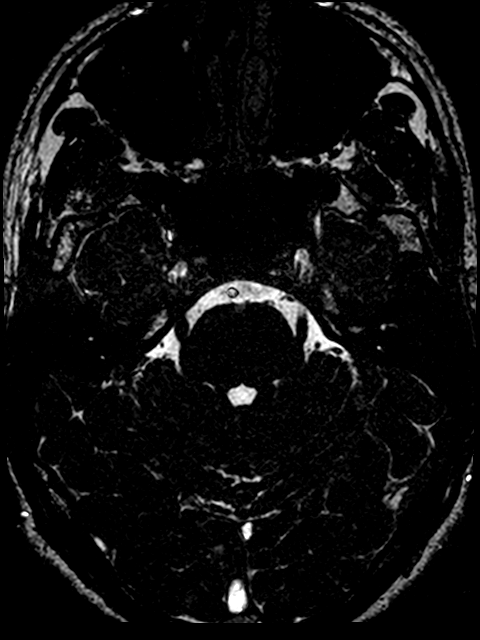
[im 44/44]
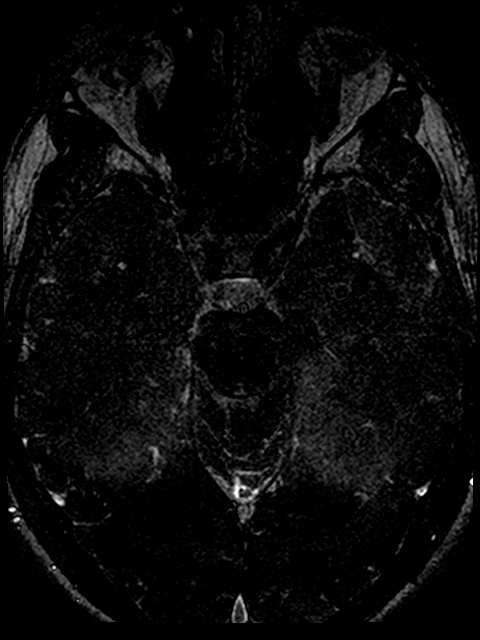

[Series 12: T1 · coronal · 3.0mm · 0.35mm/px · 1 of 11 slices shown (4 of 4)]
[im 1/11]
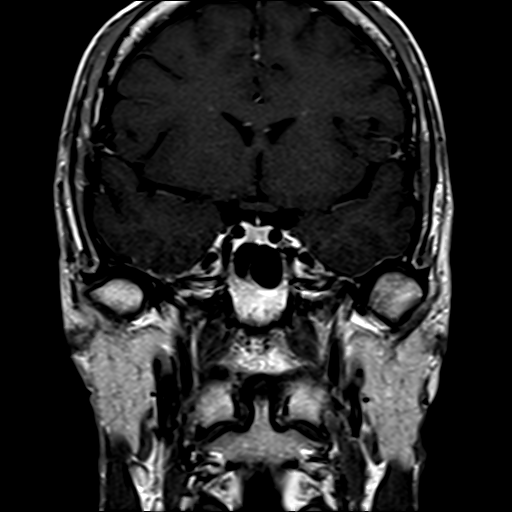

[Series 13: T1 post-contrast · axial · 3.0mm · 0.35mm/px · 1 of 11 slices shown]
[im 1/11]
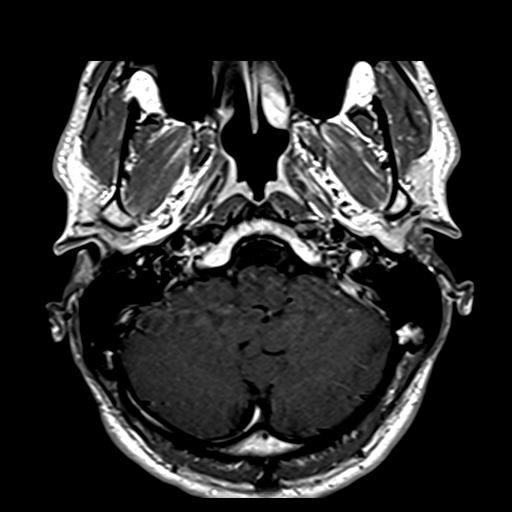

[38 of 48 positions shown; findings below may reference images not displayed]

FINDINGS: Brain: There is no evidence of acute infarct. No evidence of
intracranial mass. No chronic intracranial hemorrhage. No midline
shift or extra-axial collection. No focal parenchymal signal
abnormality. No abnormal intracranial enhancement. Specifically, no
cerebellopontine angle mass or internal auditory canal lesion is
demonstrated. Cerebral volume is normal.

Vascular: Flow voids maintained within the proximal large vessels.

Skull and upper cervical spine: Normal marrow signal.

Sinuses/Orbits: The imaged globes and orbits are unremarkable.
Minimal ethmoid sinus mucosal thickening. No significant mastoid
effusion.
IMPRESSION: Unremarkable brain MRI. No cerebellopontine angle mass or internal
auditory canal lesion is demonstrated.
